# Patient Record
Sex: Male | Born: 2000 | Race: White | Hispanic: No | Marital: Single
Health system: Midwestern US, Academic
[De-identification: ages and names within clinical notes are randomized; demographics above are authoritative.]

---
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
## 2018-01-06 ENCOUNTER — Encounter: Admit: 2018-01-06 | Discharge: 2018-01-06 | Payer: Commercial Managed Care - PPO

## 2018-01-06 ENCOUNTER — Ambulatory Visit: Admit: 2018-01-06 | Discharge: 2018-01-06 | Payer: Commercial Managed Care - PPO

## 2018-01-06 DIAGNOSIS — K50819 Crohn's disease of both small and large intestine with unspecified complications: ICD-10-CM

## 2018-01-06 DIAGNOSIS — E559 Vitamin D deficiency, unspecified: ICD-10-CM

## 2018-01-06 DIAGNOSIS — K5080 Crohn's disease of both small and large intestine without complications: ICD-10-CM

## 2018-01-06 DIAGNOSIS — Z5181 Encounter for therapeutic drug level monitoring: Principal | ICD-10-CM

## 2018-01-06 DIAGNOSIS — K611 Rectal abscess: Principal | ICD-10-CM

## 2018-01-06 DIAGNOSIS — D899 Disorder involving the immune mechanism, unspecified: ICD-10-CM

## 2018-01-06 LAB — CBC AND DIFF
Lab: 0 % (ref >60)
Lab: 0 10*3/uL (ref 0–0.20)
Lab: 0.6 10*3/uL — ABNORMAL HIGH (ref 0–0.45)
Lab: 1 10*3/uL — ABNORMAL HIGH (ref 0–0.80)
Lab: 10 % (ref 4–12)
Lab: 15 g/dL (ref 13.5–16.5)
Lab: 2.5 10*3/uL (ref 1.0–4.8)
Lab: 21 % — ABNORMAL HIGH (ref 11–15)
Lab: 26 % (ref 24–44)
Lab: 28 pg — ABNORMAL HIGH (ref 26–34)
Lab: 45 % (ref 40–50)
Lab: 5.2 M/UL (ref 4.4–5.5)
Lab: 5.5 10*3/uL (ref 1.8–7.0)
Lab: 58 % (ref <1.0)
Lab: 6 % — ABNORMAL HIGH (ref >60)
Lab: 8.5 FL (ref 7–11)
Lab: 86 FL (ref 80–100)
Lab: 9.6 10*3/uL (ref 4.5–11.0)

## 2018-01-06 LAB — COMPREHENSIVE METABOLIC PANEL
Lab: 137 MMOL/L (ref 137–147)
Lab: 4.3 MMOL/L (ref 3.5–5.1)

## 2018-01-06 LAB — VITAMIN B12: Lab: 236 pg/mL (ref 180–914)

## 2018-01-07 LAB — 25-OH VITAMIN D (D2 + D3): Lab: 21 ng/mL — ABNORMAL LOW (ref 30–80)

## 2018-01-08 ENCOUNTER — Encounter: Admit: 2018-01-08 | Discharge: 2018-01-08 | Payer: Commercial Managed Care - PPO

## 2018-01-08 DIAGNOSIS — Z5181 Encounter for therapeutic drug level monitoring: Principal | ICD-10-CM

## 2018-01-08 MED ORDER — ERGOCALCIFEROL (VITAMIN D2) 50,000 UNIT PO CAP
1 | ORAL_CAPSULE | ORAL | 0 refills | 56.00000 days | Status: AC
Start: 2018-01-08 — End: ?

## 2018-01-13 ENCOUNTER — Encounter: Admit: 2018-01-13 | Discharge: 2018-01-13 | Payer: Commercial Managed Care - PPO

## 2018-01-17 ENCOUNTER — Encounter: Admit: 2018-01-17 | Discharge: 2018-01-17 | Payer: Commercial Managed Care - PPO

## 2018-01-18 ENCOUNTER — Encounter: Admit: 2018-01-18 | Discharge: 2018-01-18 | Payer: Commercial Managed Care - PPO

## 2018-01-28 ENCOUNTER — Encounter: Admit: 2018-01-28 | Discharge: 2018-01-28 | Payer: Commercial Managed Care - PPO

## 2018-02-02 ENCOUNTER — Encounter: Admit: 2018-02-02 | Discharge: 2018-02-02 | Payer: Commercial Managed Care - PPO

## 2018-02-03 ENCOUNTER — Encounter: Admit: 2018-02-03 | Discharge: 2018-02-03 | Payer: Commercial Managed Care - PPO

## 2018-02-03 DIAGNOSIS — K611 Rectal abscess: Principal | ICD-10-CM

## 2018-02-03 DIAGNOSIS — D899 Disorder involving the immune mechanism, unspecified: ICD-10-CM

## 2018-02-03 DIAGNOSIS — E559 Vitamin D deficiency, unspecified: ICD-10-CM

## 2018-02-03 DIAGNOSIS — K50819 Crohn's disease of both small and large intestine with unspecified complications: ICD-10-CM

## 2018-02-03 LAB — COMPREHENSIVE METABOLIC PANEL
Lab: 0.7 mg/dL (ref 0.4–1.24)
Lab: 104 MMOL/L (ref 98–110)
Lab: 13 mg/dL (ref 7–25)
Lab: 135 MMOL/L — ABNORMAL LOW (ref 137–147)
Lab: 4.2 MMOL/L (ref 3.5–5.1)
Lab: 9.5 mg/dL — ABNORMAL HIGH (ref 8.5–10.6)
Lab: 91 mg/dL (ref 70–100)

## 2018-02-03 LAB — CBC AND DIFF: Lab: 12 10*3/uL — ABNORMAL HIGH (ref 4.5–11.0)

## 2018-02-03 MED ORDER — ACETAMINOPHEN 500 MG PO TAB
500 mg | Freq: Once | ORAL | 0 refills | Status: CP
Start: 2018-02-03 — End: ?
  Administered 2018-02-03: 12:00:00 500 mg via ORAL

## 2018-02-03 MED ORDER — INFLIXIMAB IVPB
5 mg/kg | Freq: Once | INTRAVENOUS | 0 refills | Status: CN
Start: 2018-02-03 — End: ?

## 2018-02-03 MED ORDER — ACETAMINOPHEN 500 MG PO TAB
500 mg | Freq: Once | ORAL | 0 refills | Status: CN
Start: 2018-02-03 — End: ?

## 2018-02-03 MED ORDER — INFLIXIMAB IVPB
5 mg/kg | Freq: Once | INTRAVENOUS | 0 refills | Status: CP
Start: 2018-02-03 — End: ?
  Administered 2018-02-03 (×2): 400 mg via INTRAVENOUS

## 2018-02-03 MED ORDER — DIPHENHYDRAMINE HCL 25 MG PO CAP
25 mg | Freq: Once | ORAL | 0 refills | Status: CP
Start: 2018-02-03 — End: ?
  Administered 2018-02-03: 12:00:00 25 mg via ORAL

## 2018-02-04 ENCOUNTER — Ambulatory Visit: Admit: 2018-02-03 | Discharge: 2018-02-04 | Payer: Commercial Managed Care - PPO

## 2018-02-04 DIAGNOSIS — K50819 Crohn's disease of both small and large intestine with unspecified complications: ICD-10-CM

## 2018-02-04 DIAGNOSIS — K50814 Crohn's disease of both small and large intestine with abscess: Principal | ICD-10-CM

## 2018-02-04 DIAGNOSIS — K611 Rectal abscess: ICD-10-CM

## 2018-02-08 LAB — INFLIXIMAB QUANT W REFLEX ANTIBODIES, SERUM

## 2018-02-09 ENCOUNTER — Encounter: Admit: 2018-02-09 | Discharge: 2018-02-09 | Payer: Commercial Managed Care - PPO

## 2018-02-10 ENCOUNTER — Encounter: Admit: 2018-02-10 | Discharge: 2018-02-10 | Payer: Commercial Managed Care - PPO

## 2018-02-10 ENCOUNTER — Ambulatory Visit: Admit: 2018-02-10 | Discharge: 2018-02-10 | Payer: Commercial Managed Care - PPO

## 2018-02-10 DIAGNOSIS — Z1211 Encounter for screening for malignant neoplasm of colon: ICD-10-CM

## 2018-02-10 DIAGNOSIS — E559 Vitamin D deficiency, unspecified: ICD-10-CM

## 2018-02-10 DIAGNOSIS — K649 Unspecified hemorrhoids: Principal | ICD-10-CM

## 2018-02-10 DIAGNOSIS — K50819 Crohn's disease of both small and large intestine with unspecified complications: ICD-10-CM

## 2018-02-10 DIAGNOSIS — K611 Rectal abscess: ICD-10-CM

## 2018-02-10 DIAGNOSIS — D899 Disorder involving the immune mechanism, unspecified: Secondary | ICD-10-CM

## 2018-02-11 ENCOUNTER — Encounter: Admit: 2018-02-11 | Discharge: 2018-02-11 | Payer: Commercial Managed Care - PPO

## 2018-02-12 LAB — INFLIXIMAB AB,S: Lab: <20

## 2018-02-15 MED ORDER — MERCAPTOPURINE 50 MG PO TAB
100 mg | ORAL_TABLET | Freq: Every day | ORAL | 4 refills | 90.00000 days | Status: AC
Start: 2018-02-15 — End: 2018-04-20

## 2018-02-17 ENCOUNTER — Encounter: Admit: 2018-02-17 | Discharge: 2018-02-17 | Payer: Commercial Managed Care - PPO

## 2018-02-17 DIAGNOSIS — D899 Disorder involving the immune mechanism, unspecified: ICD-10-CM

## 2018-02-17 DIAGNOSIS — K5080 Crohn's disease of both small and large intestine without complications: Principal | ICD-10-CM

## 2018-02-17 MED ORDER — ACETAMINOPHEN 500 MG PO TAB
500 mg | Freq: Once | ORAL | 0 refills | Status: CN
Start: 2018-02-17 — End: ?

## 2018-02-17 MED ORDER — INFLIXIMAB IVPB
5 mg/kg | Freq: Once | INTRAVENOUS | 0 refills | Status: CN
Start: 2018-02-17 — End: ?

## 2018-02-23 ENCOUNTER — Encounter: Admit: 2018-02-23 | Discharge: 2018-02-23 | Payer: Commercial Managed Care - PPO

## 2018-02-25 ENCOUNTER — Encounter: Admit: 2018-02-25 | Discharge: 2018-02-25 | Payer: Commercial Managed Care - PPO

## 2018-02-25 DIAGNOSIS — E559 Vitamin D deficiency, unspecified: ICD-10-CM

## 2018-02-25 DIAGNOSIS — D899 Disorder involving the immune mechanism, unspecified: ICD-10-CM

## 2018-02-25 DIAGNOSIS — K50819 Crohn's disease of both small and large intestine with unspecified complications: ICD-10-CM

## 2018-02-25 DIAGNOSIS — K611 Rectal abscess: Principal | ICD-10-CM

## 2018-03-10 ENCOUNTER — Encounter: Admit: 2018-03-10 | Discharge: 2018-03-10 | Payer: Commercial Managed Care - PPO

## 2018-03-10 DIAGNOSIS — Z23 Encounter for immunization: Principal | ICD-10-CM

## 2018-03-10 DIAGNOSIS — K5080 Crohn's disease of both small and large intestine without complications: ICD-10-CM

## 2018-03-15 ENCOUNTER — Encounter: Admit: 2018-03-15 | Discharge: 2018-03-15 | Payer: Commercial Managed Care - PPO

## 2018-03-15 MED ORDER — PNEUMOCOCCAL 23-VAL PS VACCINE 25 MCG/0.5 ML IJ SOLN
0.5 mL | Freq: Once | INTRAMUSCULAR | 0 refills | Status: AC
Start: 2018-03-15 — End: ?

## 2018-03-16 ENCOUNTER — Ambulatory Visit: Admit: 2018-03-16 | Discharge: 2018-03-17 | Payer: Commercial Managed Care - PPO

## 2018-03-16 DIAGNOSIS — K50819 Crohn's disease of both small and large intestine with unspecified complications: Secondary | ICD-10-CM

## 2018-03-16 LAB — CBC AND DIFF
Lab: 0 10*3/uL (ref 0–0.20)
Lab: 0.4 10*3/uL (ref 0–0.45)
Lab: 1 % (ref 0–2)
Lab: 15 % — ABNORMAL HIGH (ref 11–15)
Lab: 31 pg (ref 26–34)
Lab: 4 % (ref 0–5)
Lab: 50 % (ref 41–77)
Lab: 8.6 10*3/uL (ref 4.5–11.0)
Lab: 9 FL (ref 7–11)

## 2018-03-16 LAB — COMPREHENSIVE METABOLIC PANEL
Lab: 103 MMOL/L (ref 98–110)
Lab: 136 MMOL/L — ABNORMAL LOW (ref 137–147)
Lab: 22 U/L (ref 7–40)
Lab: 4.3 MMOL/L (ref 3.5–5.1)
Lab: 7.6 g/dL (ref 6.0–8.0)
Lab: 8 10*3/uL (ref 3–12)
Lab: 85 U/L (ref 25–110)
Lab: >60 mL/min (ref >60)
Lab: >60 mL/min — ABNORMAL HIGH (ref >60)

## 2018-03-16 MED ORDER — INFLIXIMAB IVPB
5 mg/kg | Freq: Once | INTRAVENOUS | 0 refills | Status: CN
Start: 2018-03-16 — End: ?

## 2018-03-16 MED ORDER — INFLIXIMAB IVPB
5 mg/kg | Freq: Once | INTRAVENOUS | 0 refills | Status: CP
Start: 2018-03-16 — End: ?
  Administered 2018-03-16 (×2): 450 mg via INTRAVENOUS

## 2018-03-16 MED ORDER — ACETAMINOPHEN 500 MG PO TAB
500 mg | Freq: Once | ORAL | 0 refills | Status: CN
Start: 2018-03-16 — End: ?

## 2018-03-16 MED ORDER — DIPHENHYDRAMINE HCL 25 MG PO CAP
25 mg | Freq: Once | ORAL | 0 refills | Status: CP
Start: 2018-03-16 — End: ?
  Administered 2018-03-16: 13:00:00 25 mg via ORAL

## 2018-03-16 MED ORDER — ACETAMINOPHEN 500 MG PO TAB
500 mg | Freq: Once | ORAL | 0 refills | Status: CP
Start: 2018-03-16 — End: ?
  Administered 2018-03-16: 13:00:00 500 mg via ORAL

## 2018-03-17 DIAGNOSIS — K611 Rectal abscess: ICD-10-CM

## 2018-03-17 DIAGNOSIS — K50814 Crohn's disease of both small and large intestine with abscess: Principal | ICD-10-CM

## 2018-03-19 LAB — INFLIXIMAB AB,S: Lab: <20

## 2018-03-19 LAB — INFLIXIMAB QUANT W REFLEX ANTIBODIES, SERUM: Lab: 3.1 — ABNORMAL LOW

## 2018-03-22 ENCOUNTER — Encounter: Admit: 2018-03-22 | Discharge: 2018-03-22 | Payer: Commercial Managed Care - PPO

## 2018-03-26 ENCOUNTER — Encounter: Admit: 2018-03-26 | Discharge: 2018-03-26 | Payer: Commercial Managed Care - PPO

## 2018-03-26 MED ORDER — INFLIXIMAB IVPB
10 mg/kg | Freq: Once | INTRAVENOUS | 0 refills | Status: CN
Start: 2018-03-26 — End: ?

## 2018-03-31 ENCOUNTER — Encounter: Admit: 2018-03-31 | Discharge: 2018-03-31 | Payer: Commercial Managed Care - PPO

## 2018-03-31 ENCOUNTER — Ambulatory Visit: Admit: 2018-03-31 | Discharge: 2018-04-01 | Payer: Commercial Managed Care - PPO

## 2018-03-31 ENCOUNTER — Ambulatory Visit: Admit: 2018-03-31 | Discharge: 2018-03-31 | Payer: Commercial Managed Care - PPO

## 2018-03-31 DIAGNOSIS — K50819 Crohn's disease of both small and large intestine with unspecified complications: ICD-10-CM

## 2018-03-31 DIAGNOSIS — Z79899 Other long term (current) drug therapy: Principal | ICD-10-CM

## 2018-03-31 DIAGNOSIS — Z5181 Encounter for therapeutic drug level monitoring: ICD-10-CM

## 2018-03-31 DIAGNOSIS — E559 Vitamin D deficiency, unspecified: ICD-10-CM

## 2018-03-31 DIAGNOSIS — K611 Rectal abscess: Principal | ICD-10-CM

## 2018-03-31 DIAGNOSIS — D899 Disorder involving the immune mechanism, unspecified: ICD-10-CM

## 2018-03-31 LAB — CBC AND DIFF
Lab: 0 10*3/uL (ref 0–0.20)
Lab: 4.2 M/UL — ABNORMAL LOW (ref 4.4–5.5)
Lab: 7.4 10*3/uL (ref 4.5–11.0)

## 2018-03-31 LAB — COMPREHENSIVE METABOLIC PANEL
Lab: 0.7 mg/dL (ref 0.3–1.2)
Lab: 0.8 mg/dL (ref 0.4–1.24)
Lab: 100 U/L (ref 25–110)
Lab: 106 MMOL/L (ref 98–110)
Lab: 139 MMOL/L (ref 137–147)
Lab: 16 mg/dL (ref 7–25)
Lab: 21 U/L (ref 7–40)
Lab: 21 U/L (ref 7–56)
Lab: 28 MMOL/L (ref 21–30)
Lab: 4.5 MMOL/L (ref 3.5–5.1)
Lab: 4.9 g/dL (ref 3.5–5.0)
Lab: 5 10*3/uL (ref 3–12)
Lab: 8 g/dL (ref 6.0–8.0)
Lab: 9.9 mg/dL (ref 8.5–10.6)
Lab: 93 mg/dL (ref 70–100)
Lab: >60 mL/min (ref >60)
Lab: >60 mL/min (ref >60)

## 2018-03-31 LAB — C REACTIVE PROTEIN (CRP): Lab: 0 mg/dL (ref <1.0)

## 2018-04-05 LAB — CALPROTECTIN, FECAL: Lab: 262 — ABNORMAL HIGH

## 2018-04-08 ENCOUNTER — Encounter: Admit: 2018-04-08 | Discharge: 2018-04-08 | Payer: Commercial Managed Care - PPO

## 2018-04-13 ENCOUNTER — Encounter: Admit: 2018-04-13 | Discharge: 2018-04-13 | Payer: Commercial Managed Care - PPO

## 2018-04-18 ENCOUNTER — Encounter: Admit: 2018-04-18 | Discharge: 2018-04-18 | Payer: Commercial Managed Care - PPO

## 2018-04-20 ENCOUNTER — Encounter: Admit: 2018-04-20 | Discharge: 2018-04-20 | Payer: Commercial Managed Care - PPO

## 2018-04-20 MED ORDER — MERCAPTOPURINE 50 MG PO TAB
ORAL_TABLET | Freq: Every day | ORAL | 0 refills | 90.00000 days | Status: AC
Start: 2018-04-20 — End: 2018-12-14

## 2018-04-22 ENCOUNTER — Encounter: Admit: 2018-04-22 | Discharge: 2018-04-22 | Payer: Commercial Managed Care - PPO

## 2018-05-02 ENCOUNTER — Encounter: Admit: 2018-05-02 | Discharge: 2018-05-02 | Payer: Commercial Managed Care - PPO

## 2018-05-02 DIAGNOSIS — K611 Rectal abscess: Principal | ICD-10-CM

## 2018-05-02 DIAGNOSIS — D899 Disorder involving the immune mechanism, unspecified: ICD-10-CM

## 2018-05-02 DIAGNOSIS — K50819 Crohn's disease of both small and large intestine with unspecified complications: ICD-10-CM

## 2018-05-02 DIAGNOSIS — E559 Vitamin D deficiency, unspecified: ICD-10-CM

## 2018-06-22 ENCOUNTER — Encounter: Admit: 2018-06-22 | Discharge: 2018-06-22 | Payer: Commercial Managed Care - PPO

## 2018-06-22 DIAGNOSIS — K5090 Crohn's disease, unspecified, without complications: Principal | ICD-10-CM

## 2018-07-14 ENCOUNTER — Encounter: Admit: 2018-07-14 | Discharge: 2018-07-14 | Payer: Commercial Managed Care - PPO

## 2018-07-15 ENCOUNTER — Encounter: Admit: 2018-07-15 | Discharge: 2018-07-15 | Payer: Commercial Managed Care - PPO

## 2018-07-20 ENCOUNTER — Encounter: Admit: 2018-07-20 | Discharge: 2018-07-20 | Payer: Commercial Managed Care - PPO

## 2018-07-20 ENCOUNTER — Ambulatory Visit: Admit: 2018-07-20 | Discharge: 2018-07-21 | Payer: Commercial Managed Care - PPO

## 2018-07-20 DIAGNOSIS — D899 Disorder involving the immune mechanism, unspecified: ICD-10-CM

## 2018-07-20 DIAGNOSIS — E559 Vitamin D deficiency, unspecified: ICD-10-CM

## 2018-07-20 DIAGNOSIS — K50819 Crohn's disease of both small and large intestine with unspecified complications: ICD-10-CM

## 2018-07-20 DIAGNOSIS — K611 Rectal abscess: Principal | ICD-10-CM

## 2018-07-20 LAB — CBC AND DIFF: Lab: 7.5 10*3/uL (ref 4.5–11.0)

## 2018-07-20 LAB — COMPREHENSIVE METABOLIC PANEL
Lab: 0.8 mg/dL (ref 0.4–1.24)
Lab: 1.3 mg/dL — ABNORMAL HIGH (ref 0.3–1.2)
Lab: 10 mg/dL — ABNORMAL HIGH (ref 8.5–10.6)
Lab: 113 U/L — ABNORMAL HIGH (ref 25–110)
Lab: 140 MMOL/L (ref 137–147)
Lab: 15 mg/dL (ref 7–25)
Lab: 26 U/L (ref 7–56)
Lab: 28 MMOL/L (ref 21–30)
Lab: 29 U/L (ref 7–40)
Lab: 5.2 g/dL — ABNORMAL HIGH (ref 3.5–5.0)
Lab: 67 mg/dL — ABNORMAL LOW (ref 70–100)
Lab: 8.8 g/dL — ABNORMAL HIGH (ref 6.0–8.0)

## 2018-07-20 LAB — C REACTIVE PROTEIN (CRP): Lab: 0 mg/dL (ref <1.0)

## 2018-07-20 MED ORDER — ACETAMINOPHEN 500 MG PO TAB
500 mg | Freq: Once | ORAL | 0 refills | Status: CN
Start: 2018-07-20 — End: ?

## 2018-07-20 MED ORDER — INFLIXIMAB IVPB
10 mg/kg | Freq: Once | INTRAVENOUS | 0 refills | Status: CP
Start: 2018-07-20 — End: ?
  Administered 2018-07-20 (×2): 900 mg via INTRAVENOUS

## 2018-07-20 MED ORDER — DIPHENHYDRAMINE HCL 25 MG PO CAP
25 mg | Freq: Once | ORAL | 0 refills | Status: CP
Start: 2018-07-20 — End: ?
  Administered 2018-07-20: 16:00:00 25 mg via ORAL

## 2018-07-20 MED ORDER — ACETAMINOPHEN 500 MG PO TAB
500 mg | Freq: Once | ORAL | 0 refills | Status: CP
Start: 2018-07-20 — End: ?
  Administered 2018-07-20: 16:00:00 500 mg via ORAL

## 2018-07-20 MED ORDER — INFLIXIMAB IVPB
10 mg/kg | Freq: Once | INTRAVENOUS | 0 refills | Status: CN
Start: 2018-07-20 — End: ?

## 2018-07-21 DIAGNOSIS — K611 Rectal abscess: ICD-10-CM

## 2018-07-21 DIAGNOSIS — K50814 Crohn's disease of both small and large intestine with abscess: Principal | ICD-10-CM

## 2018-07-21 DIAGNOSIS — K50819 Crohn's disease of both small and large intestine with unspecified complications: ICD-10-CM

## 2018-07-23 LAB — INFLIXIMAB QUANT W REFLEX ANTIBODIES, SERUM: Lab: 28 MMOL/L (ref 98–110)

## 2018-07-27 ENCOUNTER — Encounter: Admit: 2018-07-27 | Discharge: 2018-07-27 | Payer: Commercial Managed Care - PPO

## 2018-07-27 ENCOUNTER — Ambulatory Visit: Admit: 2018-07-27 | Discharge: 2018-07-28 | Payer: Commercial Managed Care - PPO

## 2018-07-27 DIAGNOSIS — E559 Vitamin D deficiency, unspecified: ICD-10-CM

## 2018-07-27 DIAGNOSIS — D899 Disorder involving the immune mechanism, unspecified: ICD-10-CM

## 2018-07-27 DIAGNOSIS — K5090 Crohn's disease, unspecified, without complications: Principal | ICD-10-CM

## 2018-07-27 DIAGNOSIS — K611 Rectal abscess: Principal | ICD-10-CM

## 2018-07-27 DIAGNOSIS — K5010 Crohn's disease of large intestine without complications: Principal | ICD-10-CM

## 2018-07-27 DIAGNOSIS — K50819 Crohn's disease of both small and large intestine with unspecified complications: ICD-10-CM

## 2018-07-27 MED ORDER — SODIUM CHLORIDE 0.9 % IV SOLP
INTRAVENOUS | 0 refills | Status: CN
Start: 2018-07-27 — End: ?

## 2018-07-27 MED ORDER — SODIUM,POTASSIUM,MAG SULFATES 17.5-3.13-1.6 GRAM PO SOLR
354 mL | Freq: Once | ORAL | 0 refills | 30.00000 days | Status: AC
Start: 2018-07-27 — End: ?

## 2018-08-03 ENCOUNTER — Encounter: Admit: 2018-08-03 | Discharge: 2018-08-03 | Payer: Commercial Managed Care - PPO

## 2018-08-06 ENCOUNTER — Encounter: Admit: 2018-08-06 | Discharge: 2018-08-06 | Payer: Commercial Managed Care - PPO

## 2018-08-16 ENCOUNTER — Ambulatory Visit: Admit: 2018-08-16 | Discharge: 2018-08-16 | Payer: Commercial Managed Care - PPO

## 2018-08-16 ENCOUNTER — Encounter: Admit: 2018-08-16 | Discharge: 2018-08-16 | Payer: Commercial Managed Care - PPO

## 2018-08-16 DIAGNOSIS — K611 Rectal abscess: Principal | ICD-10-CM

## 2018-08-16 DIAGNOSIS — K5010 Crohn's disease of large intestine without complications: Principal | ICD-10-CM

## 2018-08-16 DIAGNOSIS — E559 Vitamin D deficiency, unspecified: ICD-10-CM

## 2018-08-16 DIAGNOSIS — K50819 Crohn's disease of both small and large intestine with unspecified complications: ICD-10-CM

## 2018-08-16 DIAGNOSIS — D899 Disorder involving the immune mechanism, unspecified: ICD-10-CM

## 2018-08-16 MED ORDER — PROPOFOL 10 MG/ML IV EMUL 20 ML (INFUSION)(AM)(OR)
INTRAVENOUS | 0 refills | Status: DC
Start: 2018-08-16 — End: 2018-08-16
  Administered 2018-08-16: 20:00:00 150 ug/kg/min via INTRAVENOUS

## 2018-08-16 MED ORDER — PROPOFOL INJ 10 MG/ML IV VIAL
0 refills | Status: DC
Start: 2018-08-16 — End: 2018-08-16
  Administered 2018-08-16: 20:00:00 100 mg via INTRAVENOUS
  Administered 2018-08-16: 20:00:00 40 mg via INTRAVENOUS
  Administered 2018-08-16: 20:00:00 20 mg via INTRAVENOUS
  Administered 2018-08-16: 20:00:00 40 mg via INTRAVENOUS

## 2018-08-16 MED ORDER — LACTATED RINGERS IV SOLP
1000 mL | Freq: Once | INTRAVENOUS | 0 refills | Status: CP
Start: 2018-08-16 — End: ?
  Administered 2018-08-16: 20:00:00 1000.000 mL via INTRAVENOUS

## 2018-08-16 MED ORDER — LIDOCAINE (PF) 200 MG/10 ML (2 %) IJ SYRG
0 refills | Status: DC
Start: 2018-08-16 — End: 2018-08-16
  Administered 2018-08-16: 20:00:00 40 mg via INTRAVENOUS

## 2018-08-17 ENCOUNTER — Encounter: Admit: 2018-08-17 | Discharge: 2018-08-17 | Payer: Commercial Managed Care - PPO

## 2018-08-17 DIAGNOSIS — K50819 Crohn's disease of both small and large intestine with unspecified complications: ICD-10-CM

## 2018-08-17 DIAGNOSIS — E559 Vitamin D deficiency, unspecified: ICD-10-CM

## 2018-08-17 DIAGNOSIS — D899 Disorder involving the immune mechanism, unspecified: ICD-10-CM

## 2018-08-17 DIAGNOSIS — K611 Rectal abscess: Principal | ICD-10-CM

## 2018-08-23 ENCOUNTER — Encounter: Admit: 2018-08-23 | Discharge: 2018-08-23 | Payer: Commercial Managed Care - PPO

## 2018-08-23 DIAGNOSIS — E559 Vitamin D deficiency, unspecified: ICD-10-CM

## 2018-08-23 DIAGNOSIS — D899 Disorder involving the immune mechanism, unspecified: ICD-10-CM

## 2018-08-23 DIAGNOSIS — K611 Rectal abscess: Principal | ICD-10-CM

## 2018-08-23 DIAGNOSIS — K50819 Crohn's disease of both small and large intestine with unspecified complications: ICD-10-CM

## 2018-08-26 ENCOUNTER — Encounter: Admit: 2018-08-26 | Discharge: 2018-08-26 | Payer: Commercial Managed Care - PPO

## 2018-08-29 ENCOUNTER — Encounter: Admit: 2018-08-29 | Discharge: 2018-08-29 | Payer: Commercial Managed Care - PPO

## 2018-08-31 ENCOUNTER — Encounter: Admit: 2018-08-31 | Discharge: 2018-08-31 | Payer: Commercial Managed Care - PPO

## 2018-09-01 ENCOUNTER — Encounter: Admit: 2018-09-01 | Discharge: 2018-09-01 | Payer: Commercial Managed Care - PPO

## 2018-09-20 ENCOUNTER — Encounter: Admit: 2018-09-20 | Discharge: 2018-09-20 | Payer: Commercial Managed Care - PPO

## 2018-10-13 ENCOUNTER — Encounter: Admit: 2018-10-13 | Discharge: 2018-10-13 | Payer: Commercial Managed Care - PPO

## 2018-11-03 ENCOUNTER — Encounter: Admit: 2018-11-03 | Discharge: 2018-11-03 | Payer: Commercial Managed Care - PPO

## 2018-11-05 ENCOUNTER — Encounter: Admit: 2018-11-05 | Discharge: 2018-11-05 | Payer: Commercial Managed Care - PPO

## 2018-12-13 ENCOUNTER — Encounter: Admit: 2018-12-13 | Discharge: 2018-12-13 | Payer: Commercial Managed Care - PPO

## 2018-12-13 DIAGNOSIS — J351 Hypertrophy of tonsils: Secondary | ICD-10-CM

## 2018-12-14 ENCOUNTER — Encounter: Admit: 2018-12-14 | Discharge: 2018-12-14 | Payer: Commercial Managed Care - PPO

## 2018-12-14 ENCOUNTER — Ambulatory Visit: Admit: 2018-12-14 | Discharge: 2018-12-15 | Payer: Commercial Managed Care - PPO

## 2018-12-14 DIAGNOSIS — E559 Vitamin D deficiency, unspecified: Secondary | ICD-10-CM

## 2018-12-14 DIAGNOSIS — D899 Disorder involving the immune mechanism, unspecified: Secondary | ICD-10-CM

## 2018-12-14 DIAGNOSIS — K50819 Crohn's disease of both small and large intestine with unspecified complications: Secondary | ICD-10-CM

## 2018-12-14 DIAGNOSIS — K611 Rectal abscess: Secondary | ICD-10-CM

## 2018-12-15 DIAGNOSIS — K5090 Crohn's disease, unspecified, without complications: Secondary | ICD-10-CM

## 2018-12-15 DIAGNOSIS — Z79899 Other long term (current) drug therapy: Secondary | ICD-10-CM

## 2018-12-23 ENCOUNTER — Encounter: Admit: 2018-12-23 | Discharge: 2018-12-23 | Payer: Commercial Managed Care - PPO

## 2018-12-23 ENCOUNTER — Encounter: Admit: 2018-12-23 | Discharge: 2018-12-24 | Payer: Commercial Managed Care - PPO

## 2018-12-23 DIAGNOSIS — K5090 Crohn's disease, unspecified, without complications: Secondary | ICD-10-CM

## 2018-12-23 DIAGNOSIS — K50819 Crohn's disease of both small and large intestine with unspecified complications: ICD-10-CM

## 2018-12-23 DIAGNOSIS — Z79899 Other long term (current) drug therapy: Secondary | ICD-10-CM

## 2018-12-25 ENCOUNTER — Encounter: Admit: 2018-12-25 | Discharge: 2018-12-25 | Payer: Commercial Managed Care - PPO

## 2018-12-27 ENCOUNTER — Encounter: Admit: 2018-12-27 | Discharge: 2018-12-27 | Payer: Commercial Managed Care - PPO

## 2018-12-27 ENCOUNTER — Ambulatory Visit: Admit: 2018-12-27 | Discharge: 2018-12-28 | Payer: Commercial Managed Care - PPO

## 2018-12-27 DIAGNOSIS — D899 Disorder involving the immune mechanism, unspecified: ICD-10-CM

## 2018-12-27 DIAGNOSIS — E559 Vitamin D deficiency, unspecified: ICD-10-CM

## 2018-12-27 DIAGNOSIS — K611 Rectal abscess: Principal | ICD-10-CM

## 2018-12-27 DIAGNOSIS — K50819 Crohn's disease of both small and large intestine with unspecified complications: ICD-10-CM

## 2018-12-27 DIAGNOSIS — J358 Other chronic diseases of tonsils and adenoids: ICD-10-CM

## 2018-12-27 DIAGNOSIS — G4733 Obstructive sleep apnea (adult) (pediatric): ICD-10-CM

## 2018-12-28 ENCOUNTER — Encounter: Admit: 2018-12-28 | Discharge: 2018-12-28 | Payer: Commercial Managed Care - PPO

## 2018-12-28 DIAGNOSIS — J351 Hypertrophy of tonsils: Principal | ICD-10-CM

## 2018-12-29 ENCOUNTER — Encounter: Admit: 2018-12-29 | Discharge: 2018-12-29 | Payer: Commercial Managed Care - PPO

## 2018-12-30 ENCOUNTER — Encounter: Admit: 2018-12-30 | Discharge: 2018-12-30 | Payer: Commercial Managed Care - PPO

## 2018-12-30 DIAGNOSIS — K50819 Crohn's disease of both small and large intestine with unspecified complications: ICD-10-CM

## 2018-12-30 DIAGNOSIS — E559 Vitamin D deficiency, unspecified: ICD-10-CM

## 2018-12-30 DIAGNOSIS — K611 Rectal abscess: Principal | ICD-10-CM

## 2018-12-30 DIAGNOSIS — D899 Disorder involving the immune mechanism, unspecified: ICD-10-CM

## 2018-12-31 ENCOUNTER — Encounter: Admit: 2018-12-31 | Discharge: 2018-12-31 | Payer: Commercial Managed Care - PPO

## 2019-01-01 ENCOUNTER — Encounter: Admit: 2019-01-01 | Discharge: 2019-01-01 | Payer: Commercial Managed Care - PPO

## 2019-01-02 ENCOUNTER — Encounter: Admit: 2019-01-02 | Discharge: 2019-01-02 | Payer: Commercial Managed Care - PPO

## 2019-01-02 DIAGNOSIS — J358 Other chronic diseases of tonsils and adenoids: Principal | ICD-10-CM

## 2019-01-02 DIAGNOSIS — G4733 Obstructive sleep apnea (adult) (pediatric): ICD-10-CM

## 2019-01-02 DIAGNOSIS — J351 Hypertrophy of tonsils: ICD-10-CM

## 2019-01-03 ENCOUNTER — Encounter: Admit: 2019-01-03 | Discharge: 2019-01-03 | Payer: Commercial Managed Care - PPO

## 2019-01-05 ENCOUNTER — Encounter: Admit: 2019-01-05 | Discharge: 2019-01-05 | Payer: Commercial Managed Care - PPO

## 2019-01-10 ENCOUNTER — Encounter: Admit: 2019-01-10 | Discharge: 2019-01-10 | Payer: Commercial Managed Care - PPO

## 2019-01-10 DIAGNOSIS — E559 Vitamin D deficiency, unspecified: ICD-10-CM

## 2019-01-10 DIAGNOSIS — K50819 Crohn's disease of both small and large intestine with unspecified complications: ICD-10-CM

## 2019-01-10 DIAGNOSIS — K611 Rectal abscess: Principal | ICD-10-CM

## 2019-01-10 DIAGNOSIS — D899 Disorder involving the immune mechanism, unspecified: ICD-10-CM

## 2019-01-10 NOTE — Pre-Anesthesia Patient Instructions
Procedure Reminder    Please have nothing to eat after midnight 2/23- You can have clear liquids (things you can see through like coffee, tea, water, Gatorade, jello and broths- Please no red, purple , or orange in color) until 2/24 at  8am.      DATE OF SURGERY: 2/24 arrive at 11am       TAKE MORNING MEDICATIONS DISCUSSED DURING THE CALL BEFORE THE CLEAR LIQUID CUT OFF OF 8AM-     AVOID SMOKING, GUM, CANDY, (HARD CANDIES LIKE JOLLY RANCHER OR LIFE SAVER) AND MINTS AFTER MIDNIGHT ON 2/23     BATHE, SHOWER BRUSH TEETH AS YOU NORMALLY WOULD  WEAR SOMETHING CASUAL COMFORTABLE LOOSE FITTING EASY ON AND OFF   PLEASE ONLY TWO VISITORS AND NO SMALL CHILDREN AS WE HAVE A VERY SMALL WAITING ROOM    LEAVE ALL VALUABLES AND JEWELRY AT HOME  IF YOU WEAR GLASSES OR CONTACTS BRING A CASE, YOU WILL BE ASKED TO REMOVE YOUR CONTACTS PRIOR TO SURGERY  NOTIFY us IF YOU SHOULD BECOME ILL PRIOR TO SURGERY WITH FEVER OR SORE THROAT.    BRING THE INSURANCE CARD AND IDENTIFICATION FOR CHECK IN AND BE PREPARED TO PAY ANY COPAY/DEDUCTIBLE WHEN CHECKING IN     OUR ADDRESS IS [address] [place], [place] [address]  WE ARE LOCATED AT JUST OFF [address]   WE ARE ON THE [address]IDE [address] THE [address]  ONCE YOU'RE ON THE CAMPUS FOLLOW THE SIGNS TO THE SURGERY CENTER LOCATED ON THE EAST SIDE OF THE CAMPUS PARK AND ENTER THROUGH THE SURGERY CENTER DOORS.    To Reschedule call: [phone]  For Questions call: [phone]

## 2019-01-17 ENCOUNTER — Encounter: Admit: 2019-01-17 | Discharge: 2019-01-17 | Payer: Commercial Managed Care - PPO

## 2019-01-17 ENCOUNTER — Ambulatory Visit: Admit: 2019-01-17 | Discharge: 2019-01-17 | Payer: Commercial Managed Care - PPO

## 2019-01-17 DIAGNOSIS — K611 Rectal abscess: Principal | ICD-10-CM

## 2019-01-17 DIAGNOSIS — K50819 Crohn's disease of both small and large intestine with unspecified complications: ICD-10-CM

## 2019-01-17 DIAGNOSIS — E559 Vitamin D deficiency, unspecified: ICD-10-CM

## 2019-01-17 DIAGNOSIS — J351 Hypertrophy of tonsils: ICD-10-CM

## 2019-01-17 DIAGNOSIS — J358 Other chronic diseases of tonsils and adenoids: Principal | ICD-10-CM

## 2019-01-17 DIAGNOSIS — D899 Disorder involving the immune mechanism, unspecified: ICD-10-CM

## 2019-01-17 DIAGNOSIS — G4733 Obstructive sleep apnea (adult) (pediatric): ICD-10-CM

## 2019-01-17 LAB — LEUKEMIA/LYMPHOMA PANEL FLUID/TISSUE

## 2019-01-17 MED ORDER — SUCCINYLCHOLINE CHLORIDE 20 MG/ML IJ SOLN
INTRAVENOUS | 0 refills | Status: DC
Start: 2019-01-17 — End: 2019-01-17

## 2019-01-17 MED ORDER — DEXAMETHASONE SODIUM PHOSPHATE 4 MG/ML IJ SOLN
INTRAVENOUS | 0 refills | Status: DC
Start: 2019-01-17 — End: 2019-01-17

## 2019-01-17 MED ORDER — GLYCOPYRROLATE 0.2 MG/ML IJ SOLN
.2 mg | Freq: Once | INTRAVENOUS | 0 refills | Status: AC | PRN
Start: 2019-01-17 — End: ?

## 2019-01-17 MED ORDER — LACTATED RINGERS IV SOLP
1000 mL | INTRAVENOUS | 0 refills | Status: DC
Start: 2019-01-17 — End: 2019-01-22

## 2019-01-17 MED ORDER — FENTANYL CITRATE (PF) 50 MCG/ML IJ SOLN
0 refills | Status: DC
Start: 2019-01-17 — End: 2019-01-17

## 2019-01-17 MED ORDER — PROPOFOL INJ 10 MG/ML IV VIAL
0 refills | Status: DC
Start: 2019-01-17 — End: 2019-01-17

## 2019-01-17 MED ORDER — ONDANSETRON 4 MG PO TBDI
4 mg | Freq: Once | ORAL | 0 refills | Status: AC | PRN
Start: 2019-01-17 — End: ?

## 2019-01-17 MED ORDER — LIDOCAINE (PF) 200 MG/10 ML (2 %) IJ SYRG
0 refills | Status: DC
Start: 2019-01-17 — End: 2019-01-17

## 2019-01-17 MED ORDER — ONDANSETRON HCL (PF) 4 MG/2 ML IJ SOLN
4 mg | Freq: Once | INTRAVENOUS | 0 refills | Status: AC | PRN
Start: 2019-01-17 — End: ?

## 2019-01-17 MED ORDER — MIDAZOLAM 1 MG/ML IJ SOLN
INTRAVENOUS | 0 refills | Status: DC
Start: 2019-01-17 — End: 2019-01-17

## 2019-01-17 MED ORDER — CEFAZOLIN 1 GRAM IJ SOLR
0 refills | Status: DC
Start: 2019-01-17 — End: 2019-01-17

## 2019-01-17 MED ORDER — OXYCODONE 5 MG/5 ML PO SOLN
.1 mg/kg | ORAL | 0 refills | 6.00000 days | Status: AC | PRN
Start: 2019-01-17 — End: ?

## 2019-01-17 MED ORDER — ONDANSETRON 4 MG PO TBDI
4 mg | ORAL_TABLET | ORAL | 0 refills | 8.00000 days | Status: AC | PRN
Start: 2019-01-17 — End: ?

## 2019-01-17 MED ORDER — DEXMEDETOMIDINE IN 0.9 % NACL 80 MCG/20 ML (4 MCG/ML) IV SOLN
0 refills | Status: DC
Start: 2019-01-17 — End: 2019-01-17

## 2019-01-17 MED ORDER — METHYLPREDNISOLONE 4 MG PO DSPK
ORAL_TABLET | 0 refills | Status: AC
Start: 2019-01-17 — End: ?

## 2019-01-17 MED ORDER — ONDANSETRON HCL (PF) 4 MG/2 ML IJ SOLN
INTRAVENOUS | 0 refills | Status: DC
Start: 2019-01-17 — End: 2019-01-17

## 2019-01-17 MED ORDER — FENTANYL CITRATE (PF) 50 MCG/ML IJ SOLN
25 ug | INTRAVENOUS | 0 refills | Status: DC | PRN
Start: 2019-01-17 — End: 2019-01-22

## 2019-01-17 MED ORDER — ACETAMINOPHEN 160 MG/5 ML PO ELIX
500 mg | ORAL | 1 refills | Status: AC | PRN
Start: 2019-01-17 — End: ?

## 2019-01-17 MED ORDER — LIDOCAINE (PF) 10 MG/ML (1 %) IJ SOLN
.1-2 mL | INTRAMUSCULAR | 0 refills | Status: DC | PRN
Start: 2019-01-17 — End: 2019-01-22

## 2019-01-17 MED ORDER — SODIUM CHLORIDE 0.9 % IR SOLN
0 refills | Status: DC
Start: 2019-01-17 — End: 2019-01-22

## 2019-01-17 NOTE — Discharge Instructions - Supplementary Instructions
?   If a lump or redness occurs at the IV site apply a moist, warm compress for 10 minutes, repeat 4 times a day for 2-3 days.  ? Call surgeon if this persists greater than 3 days.   Medications  ? If no prescribed pain medication, Tylenol elixir may be given as directed on package label.  ? If prescribed, pain medications should be taken as directed.  Do not drive while taking them.  ? Do not use aspirin or aspirin containing compounds for pain.    Call your doctor if any of the following occur  ? Bleeding.  If heavy bleeding seek medical attention immediately at nearest emergency room.  ? Temperature over 101 degrees not relieved by 2 doses of Tylenol and increased iced liquid intake.  ? Persistent severe pain not relieved by pain medications and increased fluid intake.  ? Any questions or concerns regarding your surgery.  Follow up appointment  ? You will need to schedule a postoperative visit in {Number:[id number]} days.  ? Please call the ENT clinic at [phone] with any concerns.  ? After 5pm weekdays and on weekends call [phone] and ask for the ENT resident on call for any concerns.

## 2019-01-17 NOTE — Operative Report(Direct Entry)
OPERATIVE REPORT    Name: Thomas Cordova is a 18 y.o. male     DOB: 2001/06/26             MRN#: [id number]    DATE OF OPERATION: 01/17/2019    Surgeon(s) and Role:     Johnny Bridge, MD - Primary     * Carney Harder, MD - Resident - Assisting        Preoperative Diagnosis:    Tonsil asymmetry [J35.8]  OSA (obstructive sleep apnea) [G47.33]  Tonsillar hypertrophy [J35.1]    Post-op Diagnosis      * Tonsil asymmetry [J35.8]     * OSA (obstructive sleep apnea) [G47.33]     * Tonsillar hypertrophy [J35.1]    Procedure(s) (LRB):  TONSILLECTOMY AGE 83 YEARS AND OVER/lymphoma protocol hold pre-op decadron until procedure complete (Bilateral)    Anesthesia Type: General    Description and Findings of Operative Procedure:   Findings:  4+ right tonsil and 2+ left  non obstructive adenoids, not removed     Description and Findings of Operative Procedure: After informed consent was obtained the patient was taken to the operating room and placed supine on the operating room table.  General endotracheal intubation was performed and a surgical time out was called.  The head of bed was rotated 90 degrees and the patient was draped in the usual clean fashion.  A Crowe Davis retractor was placed and suspended from the [employer].  The palate was inspected and palpated and there was no evidence of bifid uvula or submucous cleft palate.     A curved Allis clamp was used to grasp the right tonsil and bovie electrocautery was used to remove the tonsil in a subcapsular plane.  Hemostasis was obtained with suction cautery as needed.  Next an identical procedure was performed on the contralateral side.     Red robinson catheters were placed through the bilateral nasal cavity for soft palate retraction and indirect mirror examination showed the above noted findings.       The bilateral oropharynx was then copiously irrigated and hemostasis was ensured.

## 2019-01-18 ENCOUNTER — Encounter: Admit: 2019-01-18 | Discharge: 2019-01-18 | Payer: Commercial Managed Care - PPO

## 2019-01-18 DIAGNOSIS — E559 Vitamin D deficiency, unspecified: ICD-10-CM

## 2019-01-18 DIAGNOSIS — D899 Disorder involving the immune mechanism, unspecified: ICD-10-CM

## 2019-01-18 DIAGNOSIS — K611 Rectal abscess: Principal | ICD-10-CM

## 2019-01-18 DIAGNOSIS — K50819 Crohn's disease of both small and large intestine with unspecified complications: ICD-10-CM

## 2019-01-21 ENCOUNTER — Encounter: Admit: 2019-01-21 | Discharge: 2019-01-21 | Payer: Commercial Managed Care - PPO

## 2019-01-27 ENCOUNTER — Encounter: Admit: 2019-01-27 | Discharge: 2019-01-27 | Payer: Commercial Managed Care - PPO

## 2019-01-28 ENCOUNTER — Encounter: Admit: 2019-01-28 | Discharge: 2019-01-28 | Payer: Commercial Managed Care - PPO

## 2019-02-07 ENCOUNTER — Encounter: Admit: 2019-02-07 | Discharge: 2019-02-07 | Payer: Commercial Managed Care - PPO

## 2019-02-10 ENCOUNTER — Encounter: Admit: 2019-02-10 | Discharge: 2019-02-10 | Payer: Commercial Managed Care - PPO

## 2019-02-11 ENCOUNTER — Encounter: Admit: 2019-02-11 | Discharge: 2019-02-11 | Payer: Commercial Managed Care - PPO

## 2019-02-15 ENCOUNTER — Encounter: Admit: 2019-02-15 | Discharge: 2019-02-15 | Payer: Commercial Managed Care - PPO

## 2019-02-16 ENCOUNTER — Encounter: Admit: 2019-02-16 | Discharge: 2019-02-16 | Payer: Commercial Managed Care - PPO

## 2019-02-22 ENCOUNTER — Encounter: Admit: 2019-02-22 | Discharge: 2019-02-22 | Payer: Commercial Managed Care - PPO

## 2019-03-01 ENCOUNTER — Encounter: Admit: 2019-03-01 | Discharge: 2019-03-01 | Payer: Commercial Managed Care - PPO

## 2019-03-08 ENCOUNTER — Encounter: Admit: 2019-03-08 | Discharge: 2019-03-08 | Payer: Commercial Managed Care - PPO

## 2019-03-11 IMAGING — CT Abdomen^1_ABDOMEN_PELVIS_WITH (Adult)
1 series · 15 of 32 positions shown, 19 images · IV contrast (omnipaque)
Comparison: none

PROCEDURE: ABDOMEN 1_ABDOMEN_PELVIS_WITH
HISTORY: Left perirectal abscess - Hx Crohn's x[AGE] mL Omni 300 given via IV - AK
TECHNIQUE: Axial CT imaging of the abdomen and pelvis was performed with 100 cc Omnipaque
300 IV contrast. This exam was first performed using one or more the following dose reduction
techniques: Automated exposure control, adjustment of the mA and or KV according to patient's size,
or use of a iterative reconstruction technique. Total DLP dose measures 701 mGy with a total CTDI
dose measuring 2.2 mGy.

[Series 2: abd/pelvis with 5.0 soft tissue · axial · 0.65mm/px · z∈[-474,+26]mm · 15 of 111 slices shown, 19 images]
[im 8/111  soft-tissue]
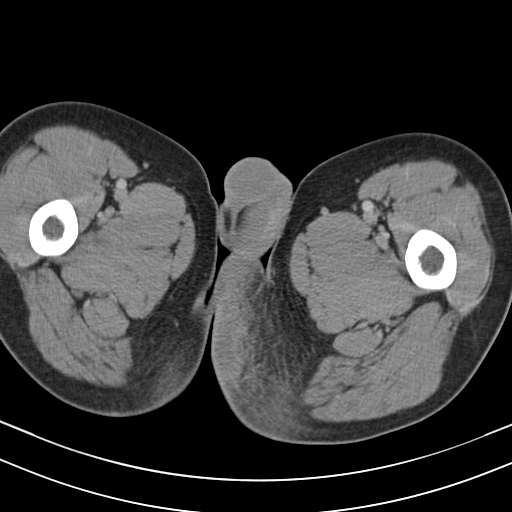
[im 8/111  bone]
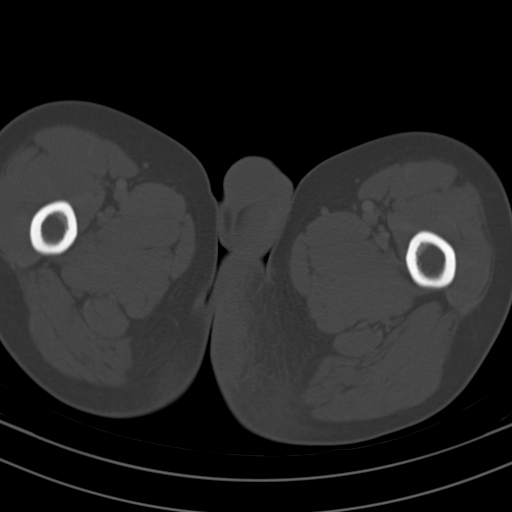
[im 15/111  soft-tissue]
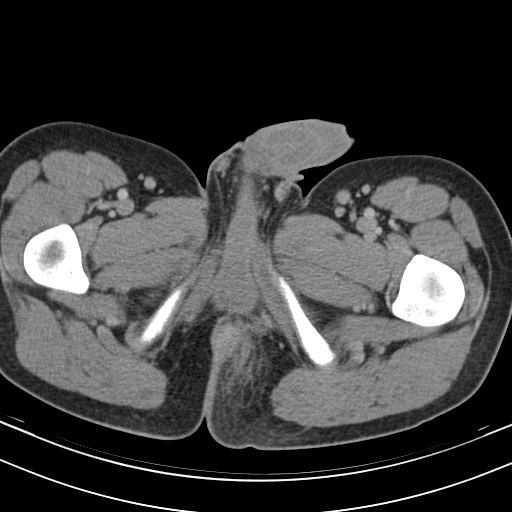
[im 22/111  soft-tissue]
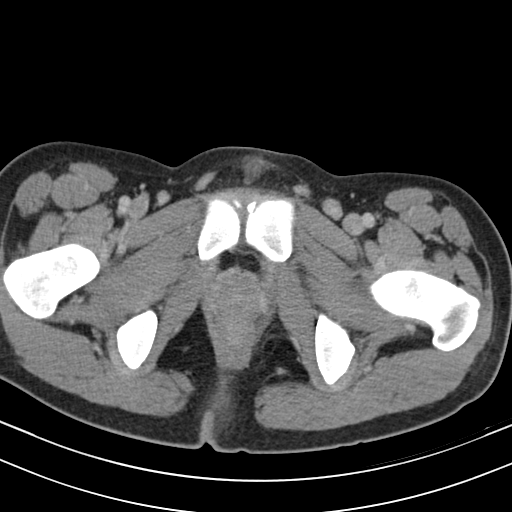
[im 32/111  soft-tissue]
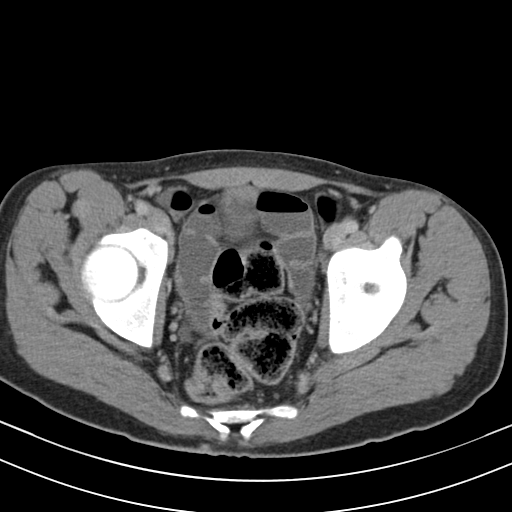
[im 40/111  soft-tissue]
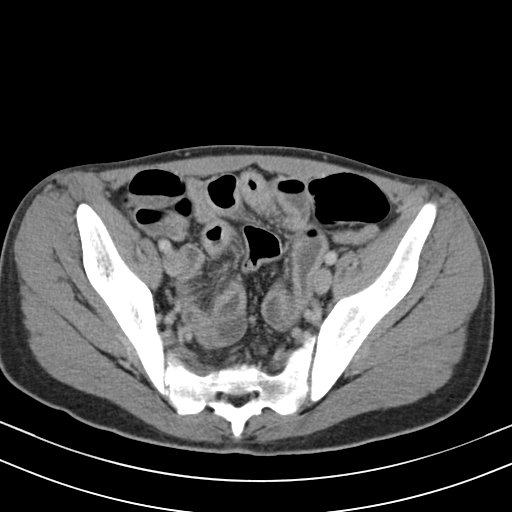
[im 47/111  soft-tissue]
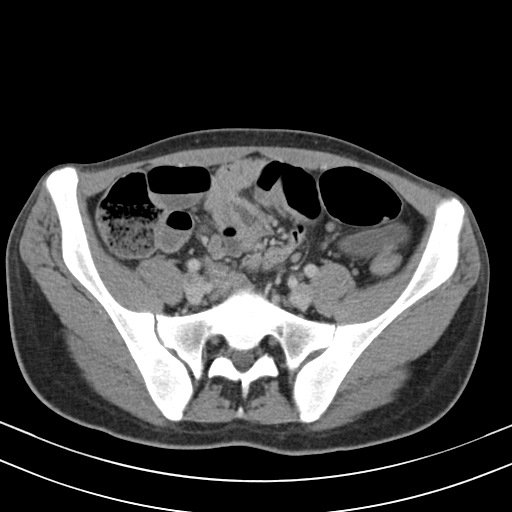
[im 57/111  soft-tissue]
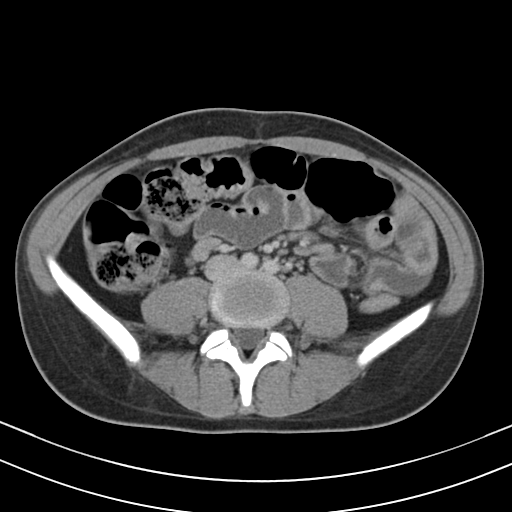
[im 64/111  soft-tissue]
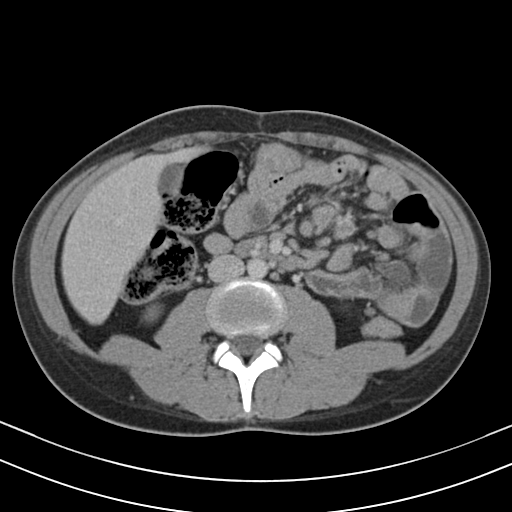
[im 71/111  soft-tissue]
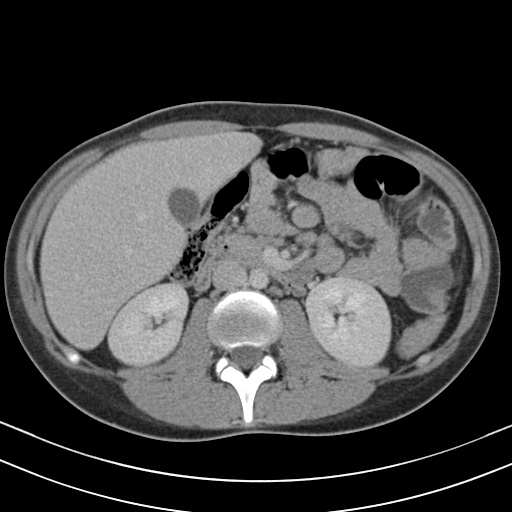
[im 71/111  bone]
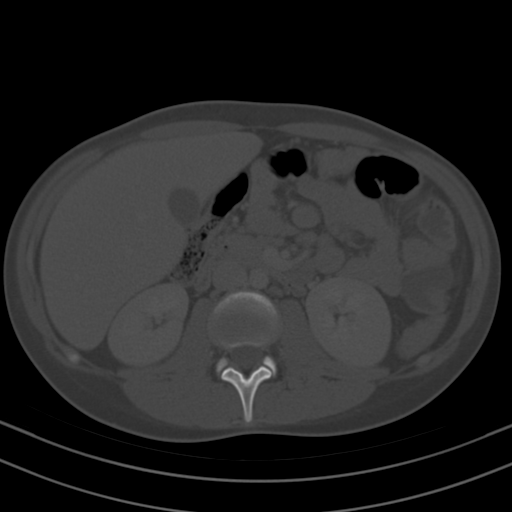
[im 79/111  soft-tissue]
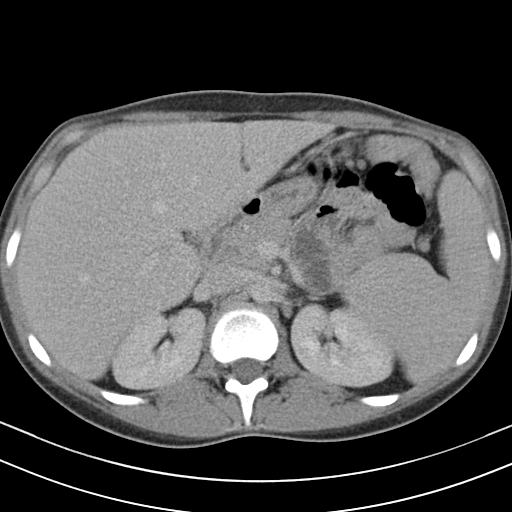
[im 89/111  soft-tissue]
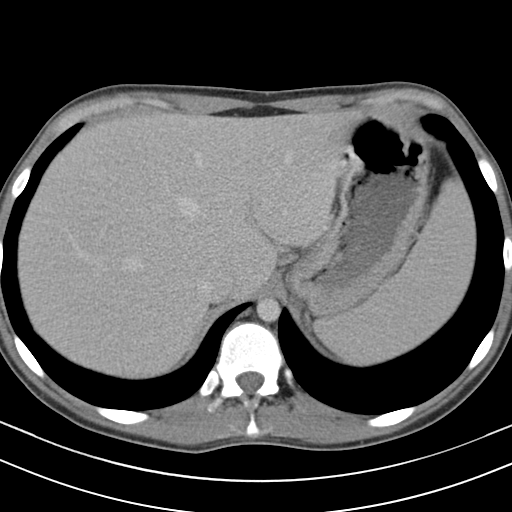
[im 96/111  soft-tissue]
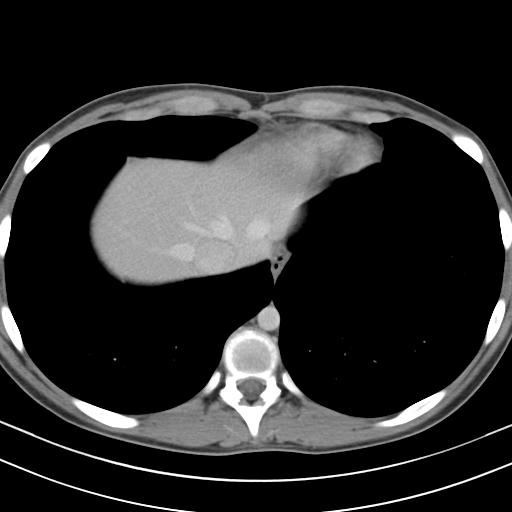
[im 96/111  lung]
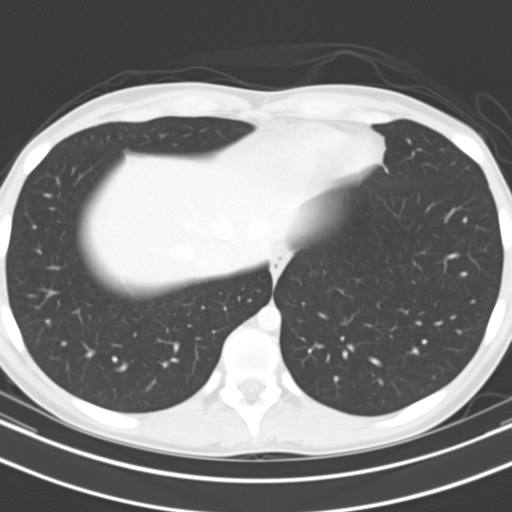
[im 100/111  lung]
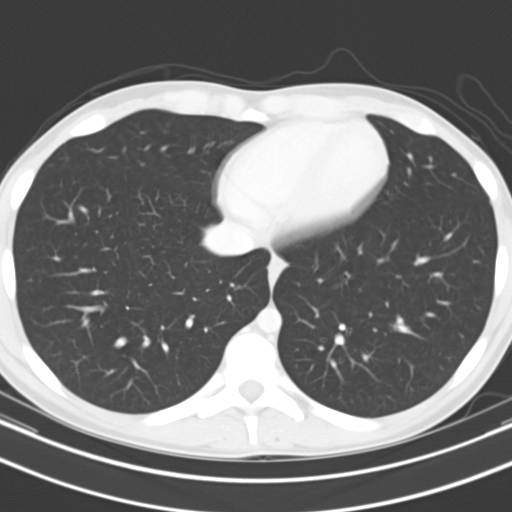
[im 103/111  soft-tissue]
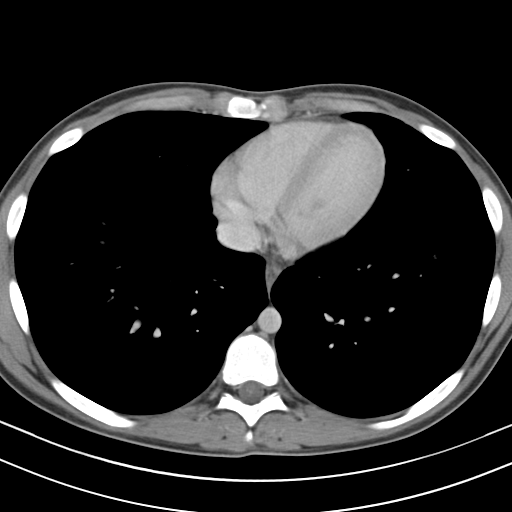
[im 103/111  lung]
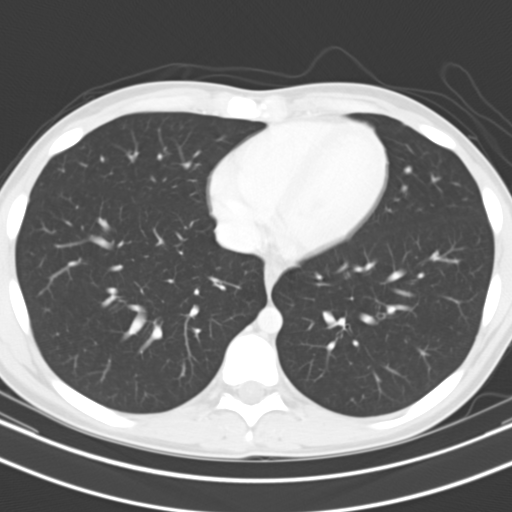
[im 107/111  lung]
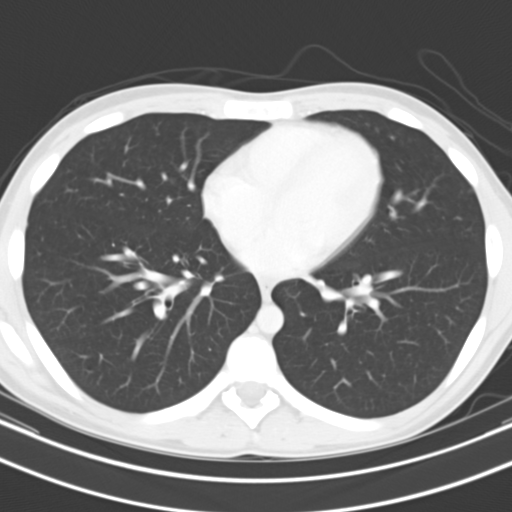

[15 of 32 positions shown; findings below may reference images not displayed]

FINDINGS: The lung bases shows a too small to characterize right basilar lung nodule. The liver, gallbladder,
pancreas, and adrenals are within normal limits. There is no intrahepatic ductal dilatation seen.
Spleen appears mildly enlarged in size measuring 13 cm. No retroperitoneal adenopathy is seen.
Multiple enlarged enhancing discrete lymph nodes are seen in the mesenteric region largest
measures 19 x 19 mm. The right kidney shows no hydronephrosis or perinephric fat stranding. The
left kidney shows no hydronephrosis or perinephric fat stranding. The aorta is normal in course and
caliber. Small peripherally enhancing collection is seen of the left perirectal region measuring
3.2 x 4.3 cm is seen. Surrounding fat stranding is seen. No supralevator extension seen.
Ischiorectal
fossae appear free. No pathologically dilated loops of large or small bowel are seen. Mild
circumferential wall thickening of ileal loops are seen in the pelvis with maximum wall thickness
measuring up to 5 mm. There is no free air or free fluid seen in the abdomen. The contrasted urinary
bladder is unremarkable.
IMPRESSION: 1. Left perirectal abscess measuring 5.6 x 3.2 x 4.3 cm in size.
2. Mesenteric lymphadenopathy is seen which is presumed to be related to #1 above. However,
follow-up exam recommended to ensure resolution.
3. Mild circumferential wall thickening of ileal loops are seen in the pelvis with maximum wall
thickness measuring up to 5 mm.
4. Mild splenomegaly.
5. There is no hydronephrosis or perinephric fat stranding seen.
6. Nonobstructive bowel gas pattern without free air.

## 2019-03-12 ENCOUNTER — Encounter: Admit: 2019-03-12 | Discharge: 2019-03-12 | Payer: Commercial Managed Care - PPO

## 2019-03-18 ENCOUNTER — Encounter: Admit: 2019-03-18 | Discharge: 2019-03-18 | Payer: Commercial Managed Care - PPO

## 2019-03-30 NOTE — Progress Notes
Specialty Medication Administration    Therapy: Remicade, 500gm, , IV, Every 6 weeks  Pre-Medications: Tylenol 500mg , Benadryl 25mg   IV Access: PIV  Labs: Infliximab and antibody levels     Start of Care: 08/31/18   Today???s Visit:  01/27/19  Next Visit: TBD after speaking with father    Patient progress/status:  Patient tolerated infusion with any issues.  Pt tolerated a max rate of 162ml/hr and infusion complete in 2 hours.  Pt has reported improvement in sxs since infusions began. Pt currently has no questions or concerns. Last labs sent to [facility] main lab, however were not able to be processed. Pt labs sent to [facility] labs this infusion, awaiting test results. Will update with next infusion date upon hearing from father.  Concerns or issues:  None  Shannan Harper, RN  [facility] Nurse  [facility]  [phone]  :

## 2019-04-04 ENCOUNTER — Encounter: Admit: 2019-04-04 | Discharge: 2019-04-04 | Payer: Commercial Managed Care - PPO

## 2019-04-08 ENCOUNTER — Encounter: Admit: 2019-04-08 | Discharge: 2019-04-08 | Payer: Commercial Managed Care - PPO

## 2019-04-08 DIAGNOSIS — K5010 Crohn's disease of large intestine without complications: ICD-10-CM

## 2019-04-08 DIAGNOSIS — K611 Rectal abscess: ICD-10-CM

## 2019-04-08 DIAGNOSIS — D899 Disorder involving the immune mechanism, unspecified: Principal | ICD-10-CM

## 2019-04-26 ENCOUNTER — Encounter: Admit: 2019-04-26 | Discharge: 2019-04-26

## 2019-04-27 NOTE — Telephone Encounter
Dr. Evelene Croon, [facility] [phone], Op. 2 then request provider to be paged. I was unable to speak with nursing staff to advise.

## 2019-06-05 ENCOUNTER — Encounter: Admit: 2019-06-05 | Discharge: 2019-06-05

## 2019-07-19 ENCOUNTER — Encounter: Admit: 2019-07-19 | Discharge: 2019-07-19

## 2019-07-20 ENCOUNTER — Encounter: Admit: 2019-07-20 | Discharge: 2019-07-20

## 2019-07-20 ENCOUNTER — Ambulatory Visit: Admit: 2019-07-20 | Discharge: 2019-07-20

## 2019-07-20 DIAGNOSIS — E559 Vitamin D deficiency, unspecified: Secondary | ICD-10-CM

## 2019-07-20 DIAGNOSIS — K5010 Crohn's disease of large intestine without complications: Principal | ICD-10-CM

## 2019-07-20 DIAGNOSIS — K611 Rectal abscess: Secondary | ICD-10-CM

## 2019-07-20 DIAGNOSIS — D899 Disorder involving the immune mechanism, unspecified: Secondary | ICD-10-CM

## 2019-07-20 DIAGNOSIS — K50819 Crohn's disease of both small and large intestine with unspecified complications: Secondary | ICD-10-CM

## 2019-07-20 LAB — CBC AND DIFF
Lab: 0 % (ref >60)
Lab: 0 10*3/uL (ref 0–0.20)
Lab: 0.1 10*3/uL (ref 0–0.45)
Lab: 0.5 10*3/uL (ref 0–0.80)
Lab: 12 % (ref 11–15)
Lab: 2.6 10*3/uL (ref 1.0–4.8)
Lab: 203 10*3/uL (ref 150–400)
Lab: 29 pg (ref 26–34)
Lab: 3.1 10*3/uL (ref 1.8–7.0)
Lab: 34 g/dL (ref 32.0–36.0)
Lab: 4.9 M/UL (ref 4.4–5.5)
Lab: 41 % (ref 24–44)
Lab: 42 % (ref 40–50)
Lab: 49 % (ref 41–77)
Lab: 6.4 K/UL (ref 4.5–11.0)
Lab: 9.7 FL (ref 7–11)

## 2019-07-20 LAB — VITAMIN B12: Lab: 335 pg/mL (ref 180–914)

## 2019-07-20 LAB — COMPREHENSIVE METABOLIC PANEL
Lab: 140 MMOL/L (ref 137–147)
Lab: 4.6 MMOL/L (ref 3.5–5.1)

## 2019-07-20 LAB — C REACTIVE PROTEIN (CRP): Lab: 0 mg/dL (ref <1.0)

## 2019-07-20 NOTE — Progress Notes
Date of Service: 07/20/2019    Subjective:             Thomas Cordova is a 18 y.o. male.    History of Present Illness  Thomas Cordova has been doing very well since his last visit.  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  Reports he is currently in person for classes, however there is been some concern that this may change with increasing COVID cases on the college campus.  He has had no diarrhea.  No abdominal pain.  He does report some occasional perianal irritation with wiping after defecation.  He said no recent infectious complications.  He has been compliant with his infusions.  When he was displaced during the last school year, he did receive 1 infusion in [place] where he was stranded for a period of time.  He subsequently was transitioned to a gastroenterologist in [place], and had no significant pickup in his infusion therapy at that location.  He reports his next infusion is due in October.     Review of Systems   Constitutional: Negative.    HENT: Negative.    Eyes: Negative.    Respiratory: Negative.    Cardiovascular: Negative.    Gastrointestinal: Negative.    Endocrine: Negative.    Genitourinary: Negative.    Musculoskeletal: Negative.    Skin: Negative.    Allergic/Immunologic: Negative.    Neurological: Negative.    Hematological: Negative.    Psychiatric/Behavioral: Negative.          Objective:         ??? acetaminophen (TYLENOL) 160 mg/5 mL elixir Take 15.6 mL by mouth every 4 hours as needed.   ??? inFLIXimab (REMICADE) 100 mg/10 mL injection Administer 900 mg through vein every 8 weeks.   ??? methylPREDNIsolone (MEDROL DOSEPAK) 4 mg tablet Take medication as directed on package for 6 days. Take with food.   ??? ondansetron (ZOFRAN ODT) 4 mg rapid dissolve tablet Dissolve one tablet by mouth every 8 hours as needed for Nausea or Vomiting. Place on tongue to disolve.   ??? oxyCODONE (ROXICODONE) 1 mg/mL oral solution Take 9.6 mL by mouth every 6 hours as needed for Pain     Vitals:    07/20/19 1115   BP: (!) 146/70   BP Source: Arm, Left Upper   Patient Position: Sitting   Pulse: 80   Temp: 36.8 ???C (98.3 ???F)   TempSrc: Oral   Weight: 103.9 kg (229 lb)   Height: 182.9 cm (72.01)   PainSc: Zero     Body mass index is 31.05 kg/m???.     Physical Exam  Genitourinary:     Prostate: No nodules present.      Rectum: No mass, anal fissure or external hemorrhoid.          Assessment and Plan:  18 yo male with h/o Crohn's disease with perianal fistula.  Reached deep remission with induction therapy with infliximab therapy. Has now reduced down to infliximab monotherapy at 10 mg/kg IV every 8 weeks.  I have recommended and will move forward with the following:    1.  We will continue on his infliximab therapy at 10 mix per kilogram IV every 8 weeks.  He has been maintained in clinical remission with this dose.  2.  We will obtain a CBC, CMP, C-reactive protein, vitamin B12, and vitamin D level at this time.  3.  I like him to return to clinic  in approximately 6 months time.    I spent approximately 25 minutes with the patient in a face-to-face manner today, over 50% of the time was spent counseling him on issues related to COVID illness and IBD including recent registry data publication, expert guidance from [facility], and potential role and limitations of vaccination in our immunosuppressed patients.

## 2019-07-20 NOTE — Patient Instructions
1) Please ensure to complete labs as discussed    2) Plan to transition you back to [facility]     Please call for any questions. Judson Roch, [place].   For up to date information on the COVID-19 virus, visit the [facility] website. [email or web address]   General supportive care during cold and flu season and infection prevention reminders:    o Wash hands often with soap and water for at least 20 seconds   o Cover your mouth and nose   o Social distancing: try to maintain 6 feet between you and other people   o Stay home if sick and symptoms mild or manageable?   If you must be around people wear a mask     If you are having symptoms of a lower respiratory infection (cough, shortness of breath) and/or fever AND either traveled in last 30 days (internationally or to region of exposure) OR known exposure to patient with COVID19:     o Call your primary care provider for questions or health needs.    Tell your doctor about your recent travel and your symptoms     o In a medical emergency, call 911 or go to the nearest emergency room.

## 2019-07-21 LAB — 25-OH VITAMIN D (D2 + D3): Lab: 43 ng/mL (ref 30–80)

## 2019-07-22 NOTE — Progress Notes
Labs look great including Vitamin D level which is now replete. If patient was taking high dose Vitamin D Rx, can transition to over the counter Vitamin D3 at a dose of 2,000 IUs per day.

## 2019-08-03 ENCOUNTER — Encounter: Admit: 2019-08-03 | Discharge: 2019-08-03

## 2019-08-15 ENCOUNTER — Encounter: Admit: 2019-08-15 | Discharge: 2019-08-15 | Payer: Commercial Managed Care - PPO

## 2019-08-16 ENCOUNTER — Encounter: Admit: 2019-08-16 | Discharge: 2019-08-16 | Payer: Commercial Managed Care - PPO

## 2019-08-16 NOTE — Telephone Encounter
Kerman Passey, MD  Jillene Bucks, RN    Phone Number: [phone]              Sitz baths for 30 minutes daily for the next week.

## 2019-08-17 ENCOUNTER — Encounter: Admit: 2019-08-17 | Discharge: 2019-08-17 | Payer: Commercial Managed Care - PPO

## 2019-08-23 NOTE — Telephone Encounter
Thomas Cordova's mother called and reports that her son has been having pain at his previous abscess site. He has seen 2 providers (1 in [place]? And the other in [place]) to inquire about possible abscess formation again. They both reported they felt it was a hair follicle, not perianal abscess. Thomas Cordova is requesting to be seen ASAP due to the new tenderness at the area and his past experience with perianal abscess.     This RN did offer an open appt tomorrow at [facility]. Address provided verbally and appt visible in MyChart.

## 2019-08-24 ENCOUNTER — Encounter: Admit: 2019-08-24 | Discharge: 2019-08-24 | Payer: Commercial Managed Care - HMO

## 2019-08-24 DIAGNOSIS — D849 Immunodeficiency, unspecified: Secondary | ICD-10-CM

## 2019-08-24 DIAGNOSIS — K50819 Crohn's disease of both small and large intestine with unspecified complications: Secondary | ICD-10-CM

## 2019-08-24 DIAGNOSIS — K611 Rectal abscess: Secondary | ICD-10-CM

## 2019-08-24 DIAGNOSIS — E559 Vitamin D deficiency, unspecified: Secondary | ICD-10-CM

## 2019-10-11 ENCOUNTER — Encounter: Admit: 2019-10-11 | Discharge: 2019-10-11 | Payer: Private Health Insurance - Indemnity

## 2019-10-17 ENCOUNTER — Encounter: Admit: 2019-10-17 | Discharge: 2019-10-17 | Payer: Private Health Insurance - Indemnity

## 2019-10-17 NOTE — Telephone Encounter
Spoke with Thomas Cordova verbalized understanding of rescheduled appointment from 10/18/2019 to 12/09/2019 with Cecil Cranker, NP at 11:30am at [facility] location.

## 2019-10-17 NOTE — Telephone Encounter
[name]'s mother called and LVM stating that Thomas Cordova received a letter he missed his appt on 11/11 with our office. This Rn advised this was a f/u to re-assess the area, but is not necessary if Thomas Cordova is doing well and has no concerns. She informs this Rn that Thomas Cordova does want to get the area looked at one last time. She requests appt tomorrow. This Rn advised the only opening is at 0930am, otherwise, our next available is 12/15 at this time. She will call her son to see if he can make it back to town for a [place] advised to call my line, LVM if he is unable to make this and we can have a scheduler call back to setup a f/u appt on date/time that works for [employer] schedule.

## 2019-10-17 NOTE — Telephone Encounter
-----   Message from Francee Piccolo, RN sent at 10/17/2019  1:19 PM CST -----  Can you please call and reschedule an appointment with Raquel Sarna or Dr. Hassell Done.     Mom called from ph: [phone] Arsenio Loader)    Pt is currently scheduled for 11/24 at 0930 with [facility], please move to Emily's template around 0/98. (if emily has nothing open, check martin's template).    Thank you,   Delynn Flavin, RN

## 2019-11-11 ENCOUNTER — Encounter: Admit: 2019-11-11 | Discharge: 2019-11-11 | Payer: Private Health Insurance - Indemnity

## 2019-11-15 ENCOUNTER — Encounter: Admit: 2019-11-15 | Discharge: 2019-11-15 | Payer: Private Health Insurance - Indemnity

## 2019-11-15 NOTE — Telephone Encounter
Received updated orders for continuation of therapy [place]. Orders signed and faxed at this time  Documents to be scanned

## 2019-12-29 ENCOUNTER — Encounter: Admit: 2019-12-29 | Discharge: 2019-12-29 | Payer: Private Health Insurance - Indemnity

## 2019-12-29 NOTE — Progress Notes
Therapy: Remicade 1000mg  every 8 weeks  IV Access: Peripheral IV  Start of Care: 08/31/18  Last visit: 12/28/19  Next visit: 02/22/20  Patient progress/status: Patient tolerated Remicade infusion without complications to max rate of 182ml/hr. Patient had last infusion in 45m during school break. Patient states he will need next infusion with [place] in March. No other concerns noted.     April BSN, RN, OCN  [facility]

## 2020-01-02 ENCOUNTER — Encounter: Admit: 2020-01-02 | Discharge: 2020-01-02 | Payer: Private Health Insurance - Indemnity

## 2020-01-16 ENCOUNTER — Encounter: Admit: 2020-01-16 | Discharge: 2020-01-16 | Payer: Private Health Insurance - Indemnity

## 2020-01-17 ENCOUNTER — Encounter: Admit: 2020-01-17 | Discharge: 2020-01-17 | Payer: Private Health Insurance - Indemnity

## 2020-01-23 ENCOUNTER — Encounter: Admit: 2020-01-23 | Discharge: 2020-01-23 | Payer: Private Health Insurance - Indemnity

## 2020-01-23 NOTE — Telephone Encounter
From: Natalia Leatherwood Voorst   Sent: Thursday, January 19, 2020 11:42 AM  To: Simonne Come @[email or web address]>; Erick Alley @[email or web address]>  Subject: Thomas Cordova MRN: [id number]    Hello Dr. Wende Crease and Pryor Ochoa from [facility] called me and told me that Remicade is no longer a preferred product as of December 26, 2019. They now prefer Inflectra or [facility]. Dr. Wende Crease, would you be willing to change to one of these preferred medications for his March 24th infusion? The current Remicade authorization is good until March 24th as well. Lelon Mast said if Dr does not want to change the medication she try and set up a peer to peer to get Remicade covered. Please let me know what you want to do. If youre OK switching to one of the preferred medications, I will need to start the authorization

## 2020-01-23 NOTE — Telephone Encounter
Notified Dr. Wende Crease to advise.

## 2020-01-25 ENCOUNTER — Encounter: Admit: 2020-01-25 | Discharge: 2020-01-25 | Payer: Private Health Insurance - Indemnity

## 2020-01-25 ENCOUNTER — Ambulatory Visit: Admit: 2020-01-25 | Discharge: 2020-01-26 | Payer: Private Health Insurance - Indemnity

## 2020-01-25 DIAGNOSIS — K611 Rectal abscess: Secondary | ICD-10-CM

## 2020-01-25 DIAGNOSIS — K50819 Crohn's disease of both small and large intestine with unspecified complications: Secondary | ICD-10-CM

## 2020-01-25 DIAGNOSIS — E559 Vitamin D deficiency, unspecified: Secondary | ICD-10-CM

## 2020-01-25 DIAGNOSIS — K5010 Crohn's disease of large intestine without complications: Secondary | ICD-10-CM

## 2020-01-25 DIAGNOSIS — D849 Immunodeficiency, unspecified: Secondary | ICD-10-CM

## 2020-01-25 DIAGNOSIS — Z79899 Other long term (current) drug therapy: Secondary | ICD-10-CM

## 2020-01-25 NOTE — Progress Notes
Date of Service: 01/25/2020    Subjective:             Thomas Cordova is a 19 y.o. male.    History of Present Illness  Patient is doing well.  No diarrhea.  No abdominal pain.  He is currently being infused every 8 weeks.  Said no fevers chills or night sweats.  No recent infectious complications.  No perianal discomfort or pain.  Overall he is doing well.     Review of Systems   Constitutional: Negative.    HENT: Negative.    Eyes: Negative.    Respiratory: Negative.    Cardiovascular: Negative.    Gastrointestinal: Negative.    Endocrine: Negative.    Genitourinary: Negative.    Musculoskeletal: Negative.    Skin: Negative.    Allergic/Immunologic: Negative.    Neurological: Negative.    Hematological: Negative.    Psychiatric/Behavioral: Negative.          Objective:         ? acetaminophen (TYLENOL) 160 mg/5 mL elixir Take 15.6 mL by mouth every 4 hours as needed.   ? ergocalciferol (VITAMIN D-2) 1,250 mcg (50,000 unit) capsule Take 1 capsule by mouth.   ? inFLIXimab (REMICADE) 100 mg/10 mL injection Administer 900 mg through vein every 8 weeks.   ? methylPREDNIsolone (MEDROL DOSEPAK) 4 mg tablet Take medication as directed on package for 6 days. Take with food.   ? ondansetron (ZOFRAN ODT) 4 mg rapid dissolve tablet Dissolve one tablet by mouth every 8 hours as needed for Nausea or Vomiting. Place on tongue to disolve.   ? oxyCODONE (ROXICODONE) 1 mg/mL oral solution Take 9.6 mL by mouth every 6 hours as needed for Pain     Vitals:    01/25/20 1229   BP: 134/77   BP Source: Arm, Left Upper   Patient Position: Sitting   Pulse: 92   Temp: 36.7 ?C (98 ?F)   TempSrc: Oral   Weight: 91.6 kg (202 lb)   Height: 182.4 cm (71.81)   PainSc: Zero     Body mass index is 27.54 kg/m?Marland Kitchen     Physical Exam  Vitals signs reviewed.   Constitutional:       General: He is not in acute distress.     Appearance: He is well-developed. He is not diaphoretic.   HENT:      Head: Normocephalic and atraumatic.      Right Ear: External ear normal.      Left Ear: External ear normal.      Nose: Nose normal.      Mouth/Throat:      Pharynx: No oropharyngeal exudate.   Eyes:      General: No scleral icterus.        Right eye: No discharge.         Left eye: No discharge.      Conjunctiva/sclera: Conjunctivae normal.      Pupils: Pupils are equal, round, and reactive to light.   Neck:      Musculoskeletal: Normal range of motion.      Thyroid: No thyromegaly.      Vascular: No JVD.      Trachea: No tracheal deviation.   Cardiovascular:      Rate and Rhythm: Normal rate and regular rhythm.   Pulmonary:      Effort: Pulmonary effort is normal. No respiratory distress.      Breath sounds: No stridor.   Abdominal:  General: There is no distension.      Palpations: Abdomen is soft. There is no mass.      Tenderness: There is no abdominal tenderness. There is no guarding or rebound.   Musculoskeletal: Normal range of motion.         General: No tenderness.   Lymphadenopathy:      Cervical: No cervical adenopathy.   Skin:     General: Skin is warm and dry.      Coloration: Skin is not pale.      Findings: No erythema or rash.   Neurological:      Mental Status: He is alert and oriented to person, place, and time.      Cranial Nerves: No cranial nerve deficit.      Coordination: Coordination normal.      Deep Tendon Reflexes: Reflexes are normal and symmetric.   Psychiatric:         Behavior: Behavior normal.         Thought Content: Thought content normal.         Judgment: Judgment normal.              Assessment and Plan:  Is a 19 year old male with history of Crohn's disease with perianal complication.  He was induced into remission with a combination of infliximab and thio purine.  Currently he is on infliximab monotherapy dosed at every 8-week intervals.  I recommended we will move forward with the following:  1.  Continue his infliximab at the current dose and dosing interval.  2.  I explained to him we would likely proceed with a colonoscopy to reassess disease activity in the fall 2021.  3.  We will check a CBC, CMP, C-reactive protein, infliximab trough level.

## 2020-02-15 ENCOUNTER — Encounter: Admit: 2020-02-15 | Discharge: 2020-02-15 | Payer: Private Health Insurance - Indemnity

## 2020-02-15 DIAGNOSIS — K50819 Crohn's disease of both small and large intestine with unspecified complications: Secondary | ICD-10-CM

## 2020-02-15 LAB — CBC AND DIFF
Lab: 0 % (ref >60)
Lab: 0 10*3/uL (ref 0–0.20)
Lab: 0.1 10*3/uL (ref 0–0.45)
Lab: 0.6 10*3/uL (ref 0–0.80)
Lab: 13 % (ref 11–15)
Lab: 14 g/dL (ref 13.5–16.5)
Lab: 2 % (ref >60)
Lab: 235 10*3/uL (ref 150–400)
Lab: 29 pg (ref 26–34)
Lab: 3.5 10*3/uL (ref 1.0–4.8)
Lab: 33 g/dL (ref 32.0–36.0)
Lab: 4.2 K/UL (ref 1.8–7.0)
Lab: 4.7 M/UL (ref 4.4–5.5)
Lab: 41 % (ref 24–44)
Lab: 41 % (ref 40–50)
Lab: 50 % (ref 41–77)
Lab: 7 % (ref 4–12)
Lab: 8.5 K/UL (ref 4.5–11.0)
Lab: 87 FL (ref 80–100)
Lab: 9.7 FL (ref 7–11)

## 2020-02-15 LAB — COMPREHENSIVE METABOLIC PANEL
Lab: 139 MMOL/L (ref 137–147)
Lab: 4.7 MMOL/L (ref 3.5–5.1)

## 2020-02-16 ENCOUNTER — Encounter: Admit: 2020-02-16 | Discharge: 2020-02-16 | Payer: Private Health Insurance - Indemnity

## 2020-02-16 NOTE — Telephone Encounter
Received call from [facility] RN advising completed patient infusion and only completed monitoring labs as tubes needed to collect Inflectra/Remicade levels were not provided, noted. HHRN states patient plans to receive next infusion in [place] ~ 5/19 as he will be traveling back.   - Communication sent to portal to have parent's assist in coordination to complete prior to next scheduled infusion; will send orders to provider in [place] if necessary.     Routing to advise Dr. Wende Crease  Communication sent to [facility] to determine error as orders were placed on updated POC, noted.

## 2020-03-06 ENCOUNTER — Encounter: Admit: 2020-03-06 | Discharge: 2020-03-06 | Payer: Private Health Insurance - Indemnity

## 2020-03-06 NOTE — Telephone Encounter
Received call from [facility] ph. [phone] re: case around 3-5. Reference # [id number]    OB call to [phone] provided information on [employer] card on file to rep, however, no group number and unable to proceed with all information provided. Ended call at this time.

## 2020-03-26 ENCOUNTER — Encounter: Admit: 2020-03-26 | Discharge: 2020-03-26 | Payer: Private Health Insurance - Indemnity

## 2020-04-01 ENCOUNTER — Encounter: Admit: 2020-04-01 | Discharge: 2020-04-01 | Payer: Private Health Insurance - Indemnity

## 2020-04-06 ENCOUNTER — Encounter: Admit: 2020-04-06 | Discharge: 2020-04-06 | Payer: Private Health Insurance - Indemnity

## 2020-04-07 ENCOUNTER — Encounter: Admit: 2020-04-07 | Discharge: 2020-04-07 | Payer: Private Health Insurance - Indemnity

## 2020-04-09 ENCOUNTER — Encounter: Admit: 2020-04-09 | Discharge: 2020-04-09 | Payer: Private Health Insurance - Indemnity

## 2020-04-10 ENCOUNTER — Encounter: Admit: 2020-04-10 | Discharge: 2020-04-10 | Payer: Private Health Insurance - Indemnity

## 2020-07-06 ENCOUNTER — Encounter: Admit: 2020-07-06 | Discharge: 2020-07-06 | Payer: Private Health Insurance - Indemnity

## 2020-07-06 NOTE — Telephone Encounter
-----   Message from Ilda Basset, RN sent at 07/06/2020  3:07 PM CDT -----  Regarding: FW: Non-Urgent Medical Question  Contact: [phone]    ----- Message -----  From: Sharlene Dory  Sent: 07/06/2020   3:03 PM CDT  To: Marylu Lund Nurse [facility]  Subject: Non-Urgent Medical Question                      Dear [name] all is well. With regards to your email from May 17 re an invoice from [employer], I just spoke with [place], and not sure why but they told me they are still waiting the corrected diagnosis and CPT code.  This is about the service provided to me on march 5th 2020. Can you please contact [employer] at [phone] or fax them at [phone]. The case reference number when you call is [id number].  Thanks  Thomas Cordova

## 2020-07-17 ENCOUNTER — Encounter: Admit: 2020-07-17 | Discharge: 2020-07-17 | Payer: Private Health Insurance - Indemnity

## 2020-07-17 NOTE — Progress Notes
Therapy: Inflectra 1000mg  every 8 weeks  IV Access: Peripheral IV  Start of Care: 08/31/18  Last visit: 07/17/20  Next visit: 09/11/20  Patient progress/status: Patient tolerated Inflectra infusion without complications to max rate of 16ml/hr. Patient had last infusion in [place] during school break. Patient states he will need next infusion with [place] in October. No other concerns noted.     Mallory Shirk, RN  Home Infusion Field Nurse  Copied into O2 for MD access or faxed to outside providers:

## 2020-07-18 ENCOUNTER — Ambulatory Visit: Admit: 2020-07-18 | Discharge: 2020-07-18 | Payer: Private Health Insurance - Indemnity

## 2020-07-18 ENCOUNTER — Encounter: Admit: 2020-07-18 | Discharge: 2020-07-18 | Payer: Private Health Insurance - Indemnity

## 2020-07-18 DIAGNOSIS — K611 Rectal abscess: Secondary | ICD-10-CM

## 2020-07-18 DIAGNOSIS — D849 Immunodeficiency, unspecified: Secondary | ICD-10-CM

## 2020-07-18 DIAGNOSIS — K50819 Crohn's disease of both small and large intestine with unspecified complications: Secondary | ICD-10-CM

## 2020-07-18 DIAGNOSIS — K5010 Crohn's disease of large intestine without complications: Principal | ICD-10-CM

## 2020-07-18 DIAGNOSIS — E559 Vitamin D deficiency, unspecified: Secondary | ICD-10-CM

## 2020-07-18 LAB — COMPREHENSIVE METABOLIC PANEL
Lab: 0.8 mg/dL (ref 0.3–1.2)
Lab: 0.9 mg/dL (ref 0.4–1.24)
Lab: 10 mg/dL (ref 8.5–10.6)
Lab: 105 MMOL/L (ref 98–110)
Lab: 11 10*3/uL (ref 3–12)
Lab: 140 MMOL/L (ref 137–147)
Lab: 15 mg/dL (ref 7–25)
Lab: 17 U/L (ref 7–56)
Lab: 19 U/L (ref 7–40)
Lab: 24 MMOL/L (ref 21–30)
Lab: 4.1 MMOL/L (ref 3.5–5.1)
Lab: 4.6 g/dL (ref 3.5–5.0)
Lab: 8.5 g/dL — ABNORMAL HIGH (ref 6.0–8.0)
Lab: 81 U/L (ref 25–110)
Lab: >60 mL/min (ref >60)
Lab: >60 mL/min (ref >60)

## 2020-07-18 LAB — CBC AND DIFF
Lab: 0 10*3/uL (ref 0–0.20)
Lab: 29 pg (ref 26–34)
Lab: 4.8 M/UL (ref 4.4–5.5)
Lab: 6.4 10*3/uL (ref 4.5–11.0)

## 2020-07-18 LAB — C REACTIVE PROTEIN (CRP): Lab: 0.1 mg/dL (ref <1.0)

## 2020-07-18 NOTE — Progress Notes
Date of Service: 07/18/2020    Subjective:             Thomas Cordova is a 19 y.o. male.    History of Present Illness  This 19 year old male presents in follow-up.  He is done extremely well.  No diarrhea.  No abdominal pain.  Weight is been stable.  He had received a dose of his infliximab in [place], prior to going back to [place] for couple months during the summer.  He is scheduled for his next infusion this week.  Has been compliant with all medications.  He has had a skin exam the last 12 months which she reports was unremarkable.  No recent infectious complications.  Overall he continues to do extremely well.     Review of Systems   Constitutional: Negative.    HENT: Negative.    Eyes: Negative.    Respiratory: Negative.    Cardiovascular: Negative.    Gastrointestinal: Negative.    Endocrine: Negative.    Genitourinary: Negative.    Musculoskeletal: Negative.    Skin: Negative.    Allergic/Immunologic: Negative.    Neurological: Negative.    Hematological: Negative.    Psychiatric/Behavioral: Negative.          Objective:         ? acetaminophen (TYLENOL) 160 mg/5 mL elixir Take 15.6 mL by mouth every 4 hours as needed.   ? ergocalciferol (VITAMIN D-2) 1,250 mcg (50,000 unit) capsule Take 1 capsule by mouth.   ? inFLIXimab (REMICADE) 100 mg/10 mL injection Administer 900 mg through vein every 8 weeks.   ? methylPREDNIsolone (MEDROL DOSEPAK) 4 mg tablet Take medication as directed on package for 6 days. Take with food.   ? ondansetron (ZOFRAN ODT) 4 mg rapid dissolve tablet Dissolve one tablet by mouth every 8 hours as needed for Nausea or Vomiting. Place on tongue to disolve.   ? oxyCODONE (ROXICODONE) 1 mg/mL oral solution Take 9.6 mL by mouth every 6 hours as needed for Pain     Vitals:    07/18/20 1326   BP: 131/77   BP Source: Arm, Left Upper   Patient Position: Sitting   Pulse: 86   Temp: 36.7 ?C (98.1 ?F)   TempSrc: Oral   Weight: 88.5 kg (195 lb)   Height: 182.4 cm (71.81)   PainSc: Zero     Body mass index is 26.59 kg/m?Marland Kitchen     Physical Exam  Constitutional:       General: He is not in acute distress.     Appearance: He is well-developed. He is not diaphoretic.   HENT:      Head: Normocephalic and atraumatic.      Right Ear: External ear normal.      Left Ear: External ear normal.      Nose: Nose normal.      Mouth/Throat:      Pharynx: No oropharyngeal exudate.   Eyes:      General: No scleral icterus.        Right eye: No discharge.         Left eye: No discharge.      Conjunctiva/sclera: Conjunctivae normal.      Pupils: Pupils are equal, round, and reactive to light.   Neck:      Thyroid: No thyromegaly.      Vascular: No JVD.      Trachea: No tracheal deviation.   Cardiovascular:      Rate and Rhythm: Normal rate and regular  rhythm.   Pulmonary:      Effort: Pulmonary effort is normal.   Musculoskeletal:         General: No tenderness. Normal range of motion.      Cervical back: Normal range of motion.   Lymphadenopathy:      Cervical: No cervical adenopathy.   Skin:     General: Skin is warm and dry.      Coloration: Skin is not pale.      Findings: No erythema or rash.   Neurological:      Mental Status: He is alert and oriented to person, place, and time.      Cranial Nerves: No cranial nerve deficit.      Coordination: Coordination normal.      Deep Tendon Reflexes: Reflexes are normal and symmetric.   Psychiatric:         Behavior: Behavior normal.         Thought Content: Thought content normal.         Judgment: Judgment normal.          Assessment and Plan:  19 year old male with history of Crohn's disease with perianal complication.  He was induced into remission with a combination of infliximab and thiopurine.  Currently he is on infliximab monotherapy dosed at every 8-week intervals.  I recommended we will move forward with the following:    1.  Continue his infliximab therapy at every 8-week intervals.  2.  We will check a CBC, CMP, C-reactive protein, and fecal calprotectin level.  3.  I like him to return to clinic in approximately 6 months time.

## 2020-07-18 NOTE — Patient Instructions
1) Please complete labs as discussed    Please call for any questions. , RN [phone].

## 2020-07-24 ENCOUNTER — Encounter: Admit: 2020-07-24 | Discharge: 2020-07-24 | Payer: Private Health Insurance - Indemnity

## 2020-09-13 ENCOUNTER — Encounter: Admit: 2020-09-13 | Discharge: 2020-09-13 | Payer: Private Health Insurance - Indemnity

## 2020-09-13 DIAGNOSIS — K50819 Crohn's disease of both small and large intestine with unspecified complications: Secondary | ICD-10-CM

## 2020-09-13 NOTE — Progress Notes
Therapy: Inflectra 1000mg  every 8 weeks  IV Access: Peripheral IV  Start of Care: 08/31/18  Last visit: 09/13/20  Next visit: 11/13/20  Patient progress/status: Patient tolerated Inflectra infusion without complications to max rate of 141ml/hr. Vitals BP 144/95, P 68, RR 18, Temp 97.3. No complaints of pain, patient reports symptoms improved with current therapy.     Concerns or issues: no issue reported at this time.    Mallory Shirk, RN  Home Infusion Field Nurse  Copied into O2 for MD access or faxed to outside providers:

## 2020-09-14 ENCOUNTER — Encounter: Admit: 2020-09-14 | Discharge: 2020-09-13 | Payer: Private Health Insurance - Indemnity

## 2020-09-14 LAB — CBC AND DIFF
Lab: 0 10*3/uL (ref 0–0.20)
Lab: 0.1 10*3/uL (ref 0–0.45)
Lab: 0.5 10*3/uL (ref 0–0.80)
Lab: 1 % (ref >60)
Lab: 10 FL (ref 7–11)
Lab: 13 % (ref 11–15)
Lab: 13 g/dL — ABNORMAL LOW (ref 13.5–16.5)
Lab: 2 % (ref >60)
Lab: 231 10*3/uL (ref 150–400)
Lab: 29 pg — ABNORMAL HIGH (ref 26–34)
Lab: 3.6 10*3/uL (ref 1.0–4.8)
Lab: 35 g/dL (ref 32.0–36.0)
Lab: 38 % — ABNORMAL LOW (ref 40–50)
Lab: 4.2 10*3/uL (ref 1.8–7.0)
Lab: 4.4 M/UL (ref 4.4–5.5)
Lab: 42 % (ref 24–44)
Lab: 49 % (ref 41–77)
Lab: 6 % (ref 4–12)
Lab: 8.6 10*3/uL (ref 4.5–11.0)
Lab: 85 FL (ref 80–100)

## 2020-09-14 LAB — COMPREHENSIVE METABOLIC PANEL
Lab: 138 MMOL/L (ref 137–147)
Lab: 4.4 MMOL/L (ref 3.5–5.1)

## 2020-10-22 ENCOUNTER — Encounter: Admit: 2020-10-22 | Discharge: 2020-10-22 | Payer: Private Health Insurance - Indemnity

## 2021-01-09 ENCOUNTER — Encounter

## 2021-01-09 DIAGNOSIS — K5010 Crohn's disease of large intestine without complications: Secondary | ICD-10-CM

## 2021-01-09 DIAGNOSIS — K611 Rectal abscess: Secondary | ICD-10-CM

## 2021-01-09 DIAGNOSIS — K50819 Crohn's disease of both small and large intestine with unspecified complications: Secondary | ICD-10-CM

## 2021-01-09 DIAGNOSIS — D849 Immunodeficiency, unspecified: Secondary | ICD-10-CM

## 2021-01-09 DIAGNOSIS — E559 Vitamin D deficiency, unspecified: Secondary | ICD-10-CM

## 2021-01-09 LAB — COMPREHENSIVE METABOLIC PANEL
Lab: 0.9 mg/dL (ref 0.4–1.24)
Lab: 140 MMOL/L (ref 137–147)
Lab: 4.8 MMOL/L (ref 3.5–5.1)
Lab: 8 (ref 3–12)
Lab: 8.4 g/dL — ABNORMAL HIGH (ref 6.0–8.0)
Lab: 80 U/L (ref 25–110)
Lab: 96 mg/dL (ref 70–100)
Lab: >60 mL/min (ref >60)

## 2021-01-09 LAB — C REACTIVE PROTEIN (CRP): Lab: 0 mg/dL (ref <1.0)

## 2021-01-09 LAB — CBC
Lab: 12 % (ref 11–15)
Lab: 14 g/dL (ref 13.5–16.5)
Lab: 191 K/UL (ref 150–400)
Lab: 29 pg (ref 26–34)
Lab: 7.5 K/UL (ref 4.5–11.0)
Lab: 84 FL (ref 80–100)
Lab: 9.4 FL (ref 7–11)

## 2021-01-11 ENCOUNTER — Encounter

## 2021-01-11 DIAGNOSIS — K5010 Crohn's disease of large intestine without complications: Secondary | ICD-10-CM

## 2021-01-11 DIAGNOSIS — K50111 Crohn's disease of large intestine with rectal bleeding: Secondary | ICD-10-CM

## 2021-01-11 LAB — COMPREHENSIVE METABOLIC PANEL
Lab: 136 MMOL/L — ABNORMAL LOW (ref 137–147)
Lab: 4.6 g/dL (ref 3.5–5.0)
Lab: 5.1 MMOL/L (ref 3.5–5.1)
Lab: 86 mg/dL (ref 70–100)

## 2021-01-11 LAB — CBC AND DIFF
Lab: 0 % (ref 0–2)
Lab: 0 K/UL (ref 0–0.20)
Lab: 0.2 K/UL (ref 0–0.45)
Lab: 0.6 K/UL (ref 0–0.80)
Lab: 14 g/dL (ref 13.5–16.5)
Lab: 174 K/UL (ref 150–400)
Lab: 28 pg (ref 26–34)
Lab: 3 % (ref >60)
Lab: 3.4 K/UL (ref 1.8–7.0)
Lab: 33 g/dL (ref 32.0–36.0)
Lab: 4.4 K/UL (ref 1.0–4.8)
Lab: 40 % — ABNORMAL LOW (ref 41–77)
Lab: 42 % (ref 40–50)
Lab: 50 % — ABNORMAL HIGH (ref <35)
Lab: 7 % (ref 4–12)
Lab: 8.7 K/UL (ref 4.5–11.0)
Lab: 85 FL (ref 80–100)

## 2021-01-11 NOTE — Progress Notes
Home Infusion Follow-Up Visit    Date of Service: 01/11/2021  Appointment Time: 0900    Thomas Cordova  [address]  [place] [address]  [phone]  Patient Language: [place]    Chief Complaint   Patient presents with   ? Follow Up     Inflectra       LDAs:     Provider: Dorathy Kinsman, RN    Department: [facility]    Visit Type: Infusion    Vitals:  Vitals:    01/11/21 0900   BP: 123/82   Pulse: 65   Resp: 16   Temp: 36.9 ?C (98.4 ?F)        Home Infusion Follow-Up Visit     Row Name 01/11/21 0900       General Visit Information    Therapy Type MAB       Review: Environment, Safety, Psychosocial    Environment Review: Living situation unchanged;Environment remains appropriate    Safety Considerations Age appropriate precautions in place    Psychosocial Review: Alert and oriented;Able to communicate effectively       Pain Assessment    Pain Loc ?  denies       Respiratory    No problems assessed - stable to baseline? No problems assessed - stable to baseline    Right Apex Breath Sounds Clear (Implies normal)    Right Base Breath Sounds Clear (Implies normal)    Left Apex Breath Sounds Clear (Implies normal)    Left Base Breath Sounds Clear (Implies normal)       Cardiovascular    No problems assessed - stable to baseline? No problems assessed - stable to baseline    Palpitations? No       Gastrointestinal    No problems assessed - stable to baseline? No problems assessed - stable to baseline       Genitourinary    No problems assessed - stable to baseline? No problems assessed - stable to baseline       Neurological    No problems assessed - stable to baseline? No problems assessed - stable to baseline       Endocrine    No problems assessed - stable to baseline? No problems assessed - stable to baseline       Skin    No problems assessed - stable to baseline? No problems assessed - stable to baseline    Skin Color Pink    Skin Condition Dry    Skin Temp Warm       Lab Draw    Labs Drawn CBC w/ diff;CMP    Specimen obtained from: Peripheral at time of PIV placement    Specimen delivered to: Wynona Dove used       Infusion Record    The following were evaluated prior to infusion: Rx has been stored properly;Anaphylaxis kit is present and within exp dates;Patient identification verified by 2 methods;Orders verified against label  Kit expires 8/22    Medications with dose, volume, administration directions Inflectra 1000mg  in NS over 2 hours via gravity drip    Pump program, if applicable n/a    Vital signs and pump changes are all detailed on a supplemental paper document: Infusion record. Yes    Medication profile reviewed No changes       Response to Therapy    Clinical response No change/stable to baseline    Progress toward goals: No S&S of infection at access site;No infusion  reactions experienced;IV access adequate for treatment;No evidence of new infections    Compliant with therapy? Yes - schedule appointments within dosing frequency       Education Provided During Visit    Reviewed the following with Pt/Cg: Reminded to call MD for changes in symptoms;Reminded to call Home Infusion for issues or changes       Next Visit Plans    Plan of treatment developed with Pt/Cg input Yes - Pt/Cg agreeable to current POT    Pt/Cg able to provide treatment? N/A - RN administered medication    Discharge plan discussed with Pt/Cg? No    Date of next visit 02/20/21    Plan for next visit: Medication administration    Date IN 01/11/21    Time IN 0900    Date OUT 01/11/21    Time OUT 1200       [REMOVED] IV Start 01/11/21 0910 Right Posterior Hand 24 G    IV Start Placement Date: 01/11/21 Placement Time: 0910 Patient Side: R Line Orientation: Posterior Line Location: Hand IV Catheter Size: 24 G # of Attempts: 1 Patient Tolerated: Yes Removal Date: 01/11/21 Removal Time: 1200                Dorathy Kinsman, RN

## 2021-02-08 ENCOUNTER — Encounter: Admit: 2021-02-08 | Discharge: 2021-02-08 | Payer: Private Health Insurance - Indemnity

## 2021-02-28 ENCOUNTER — Encounter: Admit: 2021-02-28 | Discharge: 2021-02-28 | Payer: Private Health Insurance - Indemnity

## 2021-03-01 ENCOUNTER — Encounter: Admit: 2021-03-01 | Discharge: 2021-03-01 | Payer: Private Health Insurance - Indemnity

## 2021-03-01 NOTE — Telephone Encounter
[employer] CHOICE[employer] Ph: [phone]     Spoke with representative who states for an international request need to speak with [employer] [employer] ph. [phone]    Bethann Berkshire, Rep with [employer] states [employer] will need to advise if current approval is valid out of the [place] and may need [place] provider's NPI/Tax ID; warm conference back to [facility]     Per [employer] unable to submit request for international coverage as out of the [place].     Total time spent: 45 minutes

## 2021-03-03 ENCOUNTER — Encounter: Admit: 2021-03-03 | Discharge: 2021-03-03 | Payer: Private Health Insurance - Indemnity

## 2021-03-06 ENCOUNTER — Encounter: Admit: 2021-03-06 | Discharge: 2021-03-06 | Payer: Private Health Insurance - Indemnity

## 2021-03-06 DIAGNOSIS — K50819 Crohn's disease of both small and large intestine with unspecified complications: Secondary | ICD-10-CM

## 2021-03-06 LAB — COMPREHENSIVE METABOLIC PANEL
Lab: 139 MMOL/L (ref 137–147)
Lab: 4.8 MMOL/L — ABNORMAL LOW (ref 3.5–5.1)

## 2021-03-06 LAB — CBC AND DIFF: Lab: 7.4 K/UL (ref 4.5–11.0)

## 2021-03-27 ENCOUNTER — Encounter: Admit: 2021-03-27 | Discharge: 2021-03-27 | Payer: Private Health Insurance - Indemnity

## 2021-03-27 NOTE — Telephone Encounter
Received updated notes from [facility] will be discharged.     Signed and faxed; to be scanned

## 2021-03-29 ENCOUNTER — Encounter: Admit: 2021-03-29 | Discharge: 2021-03-29 | Payer: Private Health Insurance - Indemnity
